# Patient Record
Sex: Female | Born: 1970 | Race: Black or African American | Hispanic: No | Marital: Single | Smoking: Never smoker
Health system: Southern US, Community
[De-identification: ages and names within clinical notes are randomized; demographics above are authoritative.]

## PROBLEM LIST (undated history)

## (undated) DIAGNOSIS — N83201 Unspecified ovarian cyst, right side: Secondary | ICD-10-CM

## (undated) DIAGNOSIS — R971 Elevated cancer antigen 125 [CA 125]: Secondary | ICD-10-CM

## (undated) DIAGNOSIS — K921 Melena: Secondary | ICD-10-CM

## (undated) DIAGNOSIS — N809 Endometriosis, unspecified: Secondary | ICD-10-CM

## (undated) DIAGNOSIS — N926 Irregular menstruation, unspecified: Secondary | ICD-10-CM

## (undated) DIAGNOSIS — B019 Varicella without complication: Secondary | ICD-10-CM

## (undated) DIAGNOSIS — D219 Benign neoplasm of connective and other soft tissue, unspecified: Secondary | ICD-10-CM

## (undated) DIAGNOSIS — R87619 Unspecified abnormal cytological findings in specimens from cervix uteri: Secondary | ICD-10-CM

## (undated) DIAGNOSIS — IMO0002 Reserved for concepts with insufficient information to code with codable children: Secondary | ICD-10-CM

## (undated) DIAGNOSIS — B009 Herpesviral infection, unspecified: Secondary | ICD-10-CM

## (undated) DIAGNOSIS — K635 Polyp of colon: Secondary | ICD-10-CM

## (undated) DIAGNOSIS — R109 Unspecified abdominal pain: Secondary | ICD-10-CM

## (undated) HISTORY — DX: Benign neoplasm of connective and other soft tissue, unspecified: D21.9

## (undated) HISTORY — DX: Elevated cancer antigen 125 (CA 125): R97.1

## (undated) HISTORY — DX: Melena: K92.1

## (undated) HISTORY — DX: Endometriosis, unspecified: N80.9

## (undated) HISTORY — DX: Irregular menstruation, unspecified: N92.6

## (undated) HISTORY — DX: Polyp of colon: K63.5

## (undated) HISTORY — DX: Unspecified abnormal cytological findings in specimens from cervix uteri: R87.619

## (undated) HISTORY — DX: Reserved for concepts with insufficient information to code with codable children: IMO0002

## (undated) HISTORY — DX: Varicella without complication: B01.9

## (undated) HISTORY — DX: Unspecified abdominal pain: R10.9

## (undated) HISTORY — DX: Unspecified ovarian cyst, right side: N83.201

## (undated) HISTORY — DX: Herpesviral infection, unspecified: B00.9

---

## 2005-05-11 HISTORY — PX: MYOMECTOMY: SHX85

## 2009-05-20 LAB — HM COLONOSCOPY

## 2011-02-09 LAB — HM MAMMOGRAPHY

## 2011-11-02 ENCOUNTER — Other Ambulatory Visit: Payer: Self-pay | Admitting: Family Medicine

## 2011-11-02 DIAGNOSIS — N6325 Unspecified lump in the left breast, overlapping quadrants: Secondary | ICD-10-CM

## 2011-11-05 ENCOUNTER — Other Ambulatory Visit: Payer: Self-pay

## 2011-11-10 ENCOUNTER — Ambulatory Visit
Admission: RE | Admit: 2011-11-10 | Discharge: 2011-11-10 | Disposition: A | Discharge status: Home / Self Care | Payer: BC Managed Care – PPO | Source: Ambulatory Visit | Attending: Family Medicine | Admitting: Family Medicine

## 2011-11-10 DIAGNOSIS — N6325 Unspecified lump in the left breast, overlapping quadrants: Secondary | ICD-10-CM

## 2012-02-19 ENCOUNTER — Encounter: Payer: Self-pay | Admitting: Gynecologic Oncology

## 2012-02-23 ENCOUNTER — Ambulatory Visit: Payer: BC Managed Care – PPO | Attending: Gynecologic Oncology | Admitting: Gynecologic Oncology

## 2012-02-23 ENCOUNTER — Encounter: Payer: Self-pay | Admitting: Gynecologic Oncology

## 2012-02-23 VITALS — BP 100/60 | HR 60 | Temp 98.0°F | Resp 14 | Ht 67.72 in | Wt 170.5 lb

## 2012-02-23 DIAGNOSIS — N7013 Chronic salpingitis and oophoritis: Secondary | ICD-10-CM | POA: Insufficient documentation

## 2012-02-23 DIAGNOSIS — R1900 Intra-abdominal and pelvic swelling, mass and lump, unspecified site: Secondary | ICD-10-CM | POA: Insufficient documentation

## 2012-02-23 DIAGNOSIS — N83209 Unspecified ovarian cyst, unspecified side: Secondary | ICD-10-CM | POA: Insufficient documentation

## 2012-02-23 DIAGNOSIS — N949 Unspecified condition associated with female genital organs and menstrual cycle: Secondary | ICD-10-CM

## 2012-02-23 DIAGNOSIS — N946 Dysmenorrhea, unspecified: Secondary | ICD-10-CM | POA: Insufficient documentation

## 2012-02-23 NOTE — Patient Instructions (Signed)
Options presented. 1.  Resumption of Lupron with estrogen add back. I would also recommend repeat ultrasound in 3 months with a repeat CA 125. If there is an increase in any of the masses or CA 125 at that time surgical evaluation would be recommended with a goal of bilateral salpingo-oophorectomy and hysterectomy. The 3 months of Lupron will decrease the morbidity of the surgical intervention.   2.  Exploratory laparotomy with bilateral salpingo-oophorectomy and possible hysterectomy now. There is the possibility of a rectosigmoid resection with a very small chance of requiring a colostomy. The procedure could be limited to bilateral salpingo-oophorectomy. 3.  Lupron with estrogen add back for 9 months with then a plan for definitive management of her endometriosis which would be with bilateral salpingo-oophorectomy and hysterectomy.  You have opted to to proceed with option 1.  Followup with Dr. Renaldo Fiddler for Lupron administration.

## 2012-02-23 NOTE — Progress Notes (Signed)
Consult Note: Gyn-Onc  Consult was requested by Dr. Freida Martinez for the evaluation of Eileen Martinez 41 y.o. female  CC:  Chief Complaint  Patient presents with  . adnexal cyst    HPI:  This is a 41 y/o Go with severe dysmenorrhea since 41 years old.  Initially managed unsuccessfuly with Tylenol and OCP.  Exp lap 2007 c/w severe endometriosis.  She underwent exploratory laparotomy,  apendectomy, colon resection and endometrial ablation. Residual endometriosis noted between colon and uterus.  Subsequently received lupron x 3 years with estrogen add back with immense relief.  Last dose of lupron 12/2008. She was then started on OCP x several months without subsequent pain.  Eileen Martinez states that she was doing well until approximately about 2 months ago when she was amenorrheic for 2 months.  OCP were resumed with the subsequent menses and severe dysmenorrhea.  An ultrasound collected on 01/18/2012 was notable for a 4.3 cm intramural leiomyoma. There is a 6.2 x 4.2 cm simple right ovarian cyst without blood flow. A 2.1 x 1.8 cm simple left ovarian cyst was appreciated. There was an 8.8 x 1.8 x 2.7 cm hydrosalpinx on the left. There was no blood flow seen. CA 125 is noticeably or 36.3 on September 20th 2013 and elevated to the 112.2 on 02/16/2012.  The day prior she presented to the emergency room with severe dysmenorrhea and associated nausea.    Eileen Martinez  Denies dyspareunia.     Current Meds:  Outpatient Encounter Prescriptions as of 02/23/2012  Medication Sig Dispense Refill  . levonorgestrel-ethinyl estradiol (ALTAVERA) 0.15-30 MG-MCG tablet Take 1 tablet by mouth daily.        Allergy: No Known Allergies  Social Hx:   History   Social History  . Marital Status: Single    Spouse Name: N/A    Number of Children: N/A  . Years of Education: N/A   Occupational History  . Not on file.   Social History Main Topics  . Smoking status: Never Smoker   . Smokeless tobacco: Not on file  .  Alcohol Use: No  . Drug Use: No  . Sexually Active: Yes   Other Topics Concern  . Not on file   Social History Narrative  . No narrative on file    Past Surgical Hx:  Past Surgical History  Procedure Date  . Myomectomy     Past Medical Hx:  Past Medical History  Diagnosis Date  . Abnormal Pap smear   . Herpes   . Fibroid   . Ovarian cyst, right     6cm  . Irregular menses   . Abdominal pain   . Endometriosis   . Elevated CA-125     Past Gynecological History:  G0  Patient's last menstrual period was 02/17/2012. severe dysmenorrhea since the age of 71 a history of endometriosis  Family Hx: No family history on file.  Review of Systems:  Constitutional  Feels well, Cardiovascular  No chest pain, shortness of breath, or edema  Pulmonary  No cough or wheeze.  Gastro Intestinal  No nausea, vomitting, or diarrhoea. No bright red blood per rectum, no abdominal pain, change in bowel movement, or constipation.  Genito Urinary  No frequency, urgency, dysuria,  currently on her menses, reports severe dysmenorrhea Musculo Skeletal  No myalgia, arthralgia, joint swelling or pain  Neurologic  No weakness, numbness, change in gait,  Psychology  No depression, anxiety, insomnia.   Vitals:  Blood pressure 100/60, pulse 60, temperature  98 F (36.7 C), temperature source Oral, resp. rate 14, height 5' 7.72" (1.72 m), weight 170 lb 8 oz (77.338 kg), last menstrual period 02/17/2012.  Physical Exam: WD in NAD Neck  Supple NROM, without any enlargements.  Lymph Node Survey No cervical supraclavicular or inguinal adenopathy Cardiovascular  Pulse normal rate, regularity and rhythm. S1 and S2 normal.  Lungs  Clear to auscultation bilateraly, without wheezes/crackles/rhonchi. Good air movement.  Psychiatry  Alert and oriented to person, place, and time  Abdomen  Normoactive bowel sounds, abdomen soft, non-tender and obese. Pfannenstiel incision intact without evidence of  hernia.  Back No CVA tenderness Genito Urinary  Vulva/vagina: Normal external female genitalia.  No lesions.   Bladder/urethra:  No lesions or masses  Vagina: Blood noted within the vaginal vault  Cervix: Normal appearing, no lesions.  Uterus: Lower uterine leiomyoma appreciated. No nodularity noted in the cul-de-sac .  Adnexa: No palpable masses. Rectal  Good tone, no masses no cul de sac nodularity.  Extremities  No bilateral cyanosis, clubbing or edema.   Assessment/Plan:  Ms. Eileen Martinez  is a 41 y.o.  year old with a history of severe dysmenorrhea and subsequent diagnosis of endometriosis that was successfully treated with appendectomy resection of her colon and endometrial ablation. This was followed by a 3 years of Lupron with estrogen add back and then oral contraceptive pills. The patient's symptomatology is consistent with a recurrence of her endometriosis.  Imaging is notable for a right simple cyst and a left hydrosalpinx.  Her CA 125 increased to 112 at the time of menses The following options were presented to the patient.  1.  Resumption of Lupron with estrogen add back. I would also recommend repeat ultrasound in 3 months with a repeat CA 125. If there is an increase in any of the masses or CA 125 at that time surgical evaluation would be recommended with a goal of bilateral salpingo-oophorectomy and hysterectomy. The 3 months of Lupron with decrease the morbidity of the surgical intervention.  2.  Exploratory laparotomy with bilateral salpingo-oophorectomy and possible hysterectomy now. The patient understands that there is the possibility of a rectosigmoid resection with a very small chance of requiring a colostomy. She also is aware that the procedure could be limited to bilateral salpingo-oophorectomy. 3.  Lupron with estrogen add back for 9 months with then a plan for definitive management of her endometriosis which would be with bilateral salpingo-oophorectomy and  hysterectomy.  The patient opts to proceed with option 1. She will contact Dr. Renaldo Fiddler to schedule Lupron treatment.   Laurette Schimke, MD, PhD 02/23/2012, 10:55 AM

## 2012-03-04 ENCOUNTER — Ambulatory Visit (INDEPENDENT_AMBULATORY_CARE_PROVIDER_SITE_OTHER): Payer: BC Managed Care – PPO | Admitting: Family Medicine

## 2012-03-04 ENCOUNTER — Encounter: Payer: Self-pay | Admitting: Family Medicine

## 2012-03-04 VITALS — BP 108/70 | HR 89 | Temp 98.2°F

## 2012-03-04 DIAGNOSIS — Z13220 Encounter for screening for lipoid disorders: Secondary | ICD-10-CM

## 2012-03-04 DIAGNOSIS — N809 Endometriosis, unspecified: Secondary | ICD-10-CM

## 2012-03-04 DIAGNOSIS — J309 Allergic rhinitis, unspecified: Secondary | ICD-10-CM | POA: Insufficient documentation

## 2012-03-04 DIAGNOSIS — Z23 Encounter for immunization: Secondary | ICD-10-CM

## 2012-03-04 DIAGNOSIS — Z131 Encounter for screening for diabetes mellitus: Secondary | ICD-10-CM

## 2012-03-04 DIAGNOSIS — L732 Hidradenitis suppurativa: Secondary | ICD-10-CM

## 2012-03-04 LAB — LIPID PANEL: Total CHOL/HDL Ratio: 4

## 2012-03-04 LAB — HEMOGLOBIN A1C: Hgb A1c MFr Bld: 4.7 % (ref 4.6–6.5)

## 2012-03-04 MED ORDER — CLINDAMYCIN PHOSPHATE 1 % EX GEL: Freq: Two times a day (BID) | CUTANEOUS | Status: DC

## 2012-03-04 NOTE — Addendum Note (Signed)
Addended by: Azucena Freed on: 03/04/2012 12:17 PM   Modules accepted: Orders

## 2012-03-04 NOTE — Progress Notes (Signed)
Chief Complaint  Patient presents with  . Establish Care    HPI: Eileen Martinez is here to establish care.  Has the following concerns today:  Hidradenitis Suppurativa: -dx one year ago after boil developed and pustules in axillary and groin area -being tx with clobetasol Propionate topical and initially was on Amoxicillin for 10 days -flared up now     Other Providers: -Sees Dr. Renaldo Fiddler with gyn for her yearly physicals and endometriosis, also being closely  followed by Dr. Nelly Rout - on Lupron therapy, hysterectomy being consider. -Sees Dr. [place] Callas - for AR -colonoscopy in 2011  Flu vaccine: refused Tdap: doesn't remember  ROS: See pertinent positives and negatives per HPI.  Past Medical History  Diagnosis Date  . Abnormal Pap smear   . Herpes   . Fibroid   . Ovarian cyst, right     6cm  . Irregular menses   . Abdominal pain   . Endometriosis   . Elevated CA-125   . Blood in stool   . Chicken pox   . Polyp of colon     Family History  Problem Relation Age of Onset  . Arthritis      parent  . Breast cancer Other   . Heart disease Other   . Stroke Other   . Hypertension      parent  . Hypertension Other   . Mental illness Other   . Diabetes Other     History   Social History  . Marital Status: Single    Spouse Name: N/A    Number of Children: N/A  . Years of Education: N/A   Social History Main Topics  . Smoking status: Never Smoker   . Smokeless tobacco: None  . Alcohol Use: No  . Drug Use: No  . Sexually Active: Yes   Other Topics Concern  . None   Social History Narrative  . None    Current outpatient prescriptions:clobetasol ointment (TEMOVATE) 0.05 %, Apply 1 application topically 2 (two) times daily., Disp: , Rfl: ;  EPIPEN 2-PAK 0.3 MG/0.3ML DEVI, , Disp: , Rfl: ;  HYDROcodone-acetaminophen (NORCO/VICODIN) 5-325 MG per tablet, Take 1 tablet by mouth every 6 (six) hours as needed. , Disp: , Rfl: ;  levonorgestrel-ethinyl estradiol  (ALTAVERA) 0.15-30 MG-MCG tablet, Take 1 tablet by mouth daily., Disp: , Rfl:  Mefenamic Acid 250 MG CAPS, , Disp: , Rfl: ;  montelukast (SINGULAIR) 10 MG tablet, , Disp: , Rfl: ;  ondansetron (ZOFRAN-ODT) 4 MG disintegrating tablet, , Disp: , Rfl: ;  PATADAY 0.2 % SOLN, , Disp: , Rfl: ;  PROAIR HFA 108 (90 BASE) MCG/ACT inhaler, , Disp: , Rfl: ;  QNASL 80 MCG/ACT AERS, , Disp: , Rfl: ;  Spacer/Aero-Holding Chambers (AEROCHAMBER MAX W/FLOW-VU) MISC, , Disp: , Rfl:  sulfamethoxazole-trimethoprim (BACTRIM DS) 800-160 MG per tablet, , Disp: , Rfl: ;  clindamycin (CLINDAGEL) 1 % gel, Apply topically 2 (two) times daily. To affected areas, Disp: 30 g, Rfl: 3  EXAM:  Filed Vitals:   03/04/12 0919  BP: 108/70  Pulse: 89  Temp: 98.2 F (36.8 C)    There is no height or weight on file to calculate BMI.  GENERAL: vitals reviewed and listed above, alert, oriented, appears well hydrated and in no acute distress  HEENT: atraumatic, conjunttiva clear, no obvious abnormalities on inspection of external nose and ears  NECK: no obvious masses on inspection  LUNGS: clear to auscultation bilaterally, no wheezes, rales or rhonchi, good air movement  CV: HRRR, no peripheral edema  SKIN: scattered small papules axillary and groin region  MS: moves all extremities without noticeable abnormality  PSYCH: pleasant and cooperative, no obvious depression or anxiety  ASSESSMENT AND PLAN:  Discussed the following assessment and plan:  1. Screening for diabetes mellitus  Hemoglobin A1c  2. Screening for hypercholesterolemia  Lipid Panel  3. Hidradenitis  clindamycin (CLINDAGEL) 1 % gel  4. Endometriosis, followed by Dr. Renaldo Fiddler and Dr. Nelly Rout    5. Allergic rhinitis, sees Dr. [place] Callas     -We reviewed the PMH, PSH, FH, SH, Meds and Allergies. -We addressed current concerns per orders and patient instructions. -We have asked for records for pertinent exams, studies, vaccines and notes from previous  providers. -We have advised patient to follow up per instructions below. -Influenza vaccine refused, tdap given  -Patient advised to return as scheduled or notify us if new concerns arise.  Patient Instructions  -We have ordered labs or studies at this visit. It can take up to 1-2 weeks for results and processing. We will contact you with instructions IF your results are abnormal. Normal results will be released to your [facility]. If you have not heard from [place] or can not find your results in [facility] in 2 weeks please contact our office.  -PLEASE SIGN UP FOR MYCHART TODAY   We recommend the following healthy lifestyle measures: - eat a healthy diet consisting of lots of vegetables, fruits, beans, nuts, seeds, healthy meats such as white chicken and fish and whole grains.  - avoid fried foods, fast food, processed foods, sodas, red meet and other fattening foods.  - get a least 150 minutes of aerobic exercise per week.   FOR THE HIDRADENITIS: -loose cool cotton clothing -hypoallergenic soaps and detergents -to not touch or manipulate lesions -use antibiotic gel as instructed -can try decreasing dairy and or carbs in diet -dove clear tone Deoderant  Follow up in: 2 months      Junelle Hashemi R.

## 2012-03-04 NOTE — Patient Instructions (Addendum)
-  We have ordered labs or studies at this visit. It can take up to 1-2 weeks for results and processing. We will contact you with instructions IF your results are abnormal. Normal results will be released to your [facility]. If you have not heard from [place] or can not find your results in [facility] in 2 weeks please contact our office.  -PLEASE SIGN UP FOR MYCHART TODAY   We recommend the following healthy lifestyle measures: - eat a healthy diet consisting of lots of vegetables, fruits, beans, nuts, seeds, healthy meats such as white chicken and fish and whole grains.  - avoid fried foods, fast food, processed foods, sodas, red meet and other fattening foods.  - get a least 150 minutes of aerobic exercise per week.   FOR THE HIDRADENITIS: -loose cool cotton clothing -hypoallergenic soaps and detergents -to not touch or manipulate lesions -use antibiotic gel as instructed -can try decreasing dairy and or carbs in diet -dove clear tone Deoderant  Follow up in: 2 months

## 2012-04-28 ENCOUNTER — Ambulatory Visit (INDEPENDENT_AMBULATORY_CARE_PROVIDER_SITE_OTHER): Payer: BC Managed Care – PPO | Admitting: Family Medicine

## 2012-04-28 ENCOUNTER — Encounter: Payer: Self-pay | Admitting: Family Medicine

## 2012-04-28 VITALS — BP 102/70 | HR 61 | Temp 98.6°F | Wt 166.0 lb

## 2012-04-28 DIAGNOSIS — L732 Hidradenitis suppurativa: Secondary | ICD-10-CM

## 2012-04-28 NOTE — Progress Notes (Signed)
Chief Complaint  Patient presents with  . 2 month follow up    HPI:  Follow up:  Hidradenitis: -tx with supportive care and clinda gel last visit -she has followed all of the recommendations and has been great - no bumps at all -hasn't had to use gel for about a month and all has been great -she has made big changes in diet - increased fiber, healthier diet - she is doing great -had follow up with her gynecologist yesterday and fibroids shrunk and cysts disappeared - she is going to follow up in 3 months with them  ROS: See pertinent positives and negatives per HPI.  Past Medical History  Diagnosis Date  . Abnormal Pap smear   . Herpes   . Fibroid   . Ovarian cyst, right     6cm  . Irregular menses   . Abdominal pain   . Endometriosis   . Elevated CA-125   . Blood in stool   . Chicken pox   . Polyp of colon     Family History  Problem Relation Age of Onset  . Arthritis      parent  . Breast cancer Other   . Heart disease Other   . Stroke Other   . Hypertension      parent  . Hypertension Other   . Mental illness Other   . Diabetes Other     History   Social History  . Marital Status: Single    Spouse Name: N/A    Number of Children: N/A  . Years of Education: N/A   Social History Main Topics  . Smoking status: Never Smoker   . Smokeless tobacco: None  . Alcohol Use: No  . Drug Use: No  . Sexually Active: Yes   Other Topics Concern  . None   Social History Narrative  . None    Current outpatient prescriptions:clindamycin (CLINDAGEL) 1 % gel, Apply topically 2 (two) times daily. To affected areas, Disp: 30 g, Rfl: 3;  clobetasol ointment (TEMOVATE) 0.05 %, Apply 1 application topically 2 (two) times daily., Disp: , Rfl: ;  EPIPEN 2-PAK 0.3 MG/0.3ML DEVI, , Disp: , Rfl: ;  HYDROcodone-acetaminophen (NORCO/VICODIN) 5-325 MG per tablet, Take 1 tablet by mouth every 6 (six) hours as needed. , Disp: , Rfl:  levonorgestrel-ethinyl estradiol (ALTAVERA)  0.15-30 MG-MCG tablet, Take 1 tablet by mouth daily., Disp: , Rfl: ;  Mefenamic Acid 250 MG CAPS, , Disp: , Rfl: ;  montelukast (SINGULAIR) 10 MG tablet, , Disp: , Rfl: ;  ondansetron (ZOFRAN-ODT) 4 MG disintegrating tablet, , Disp: , Rfl: ;  PATADAY 0.2 % SOLN, , Disp: , Rfl: ;  PROAIR HFA 108 (90 BASE) MCG/ACT inhaler, , Disp: , Rfl: ;  QNASL 80 MCG/ACT AERS, , Disp: , Rfl:  Spacer/Aero-Holding Chambers (AEROCHAMBER MAX W/FLOW-VU) MISC, , Disp: , Rfl: ;  sulfamethoxazole-trimethoprim (BACTRIM DS) 800-160 MG per tablet, , Disp: , Rfl:   EXAM:  Filed Vitals:   04/28/12 1004  BP: 102/70  Pulse: 61  Temp: 98.6 F (37 C)    There is no height on file to calculate BMI.  GENERAL: vitals reviewed and listed above, alert, oriented, appears well hydrated and in no acute distress  MS: moves all extremities without noticeable abnormality  PSYCH: pleasant and cooperative, no obvious depression or anxiety  ASSESSMENT AND PLAN:  Discussed the following assessment and plan:  1. Hidradenitis   -resolved, patient is very happy about this -Patient advised to return  or notify a doctor immediately if symptoms worsen or persist or new concerns arise.  Patient Instructions  -follow up yearly and as needed     Siomara Burkel, Dahlia Client R.

## 2012-04-28 NOTE — Patient Instructions (Signed)
-  follow up yearly and as needed

## 2012-06-25 ENCOUNTER — Other Ambulatory Visit: Payer: Self-pay

## 2012-09-30 ENCOUNTER — Encounter: Payer: Self-pay | Admitting: Family Medicine

## 2012-09-30 NOTE — Progress Notes (Signed)
OV notes from asthma and allergy visit from 09/23/12 scanned in.

## 2013-03-16 ENCOUNTER — Other Ambulatory Visit: Payer: Self-pay

## 2013-04-28 ENCOUNTER — Ambulatory Visit (INDEPENDENT_AMBULATORY_CARE_PROVIDER_SITE_OTHER): Payer: BC Managed Care – PPO | Admitting: Family Medicine

## 2013-04-28 VITALS — BP 98/68 | Temp 99.2°F | Wt 171.0 lb

## 2013-04-28 DIAGNOSIS — J309 Allergic rhinitis, unspecified: Secondary | ICD-10-CM

## 2013-04-28 DIAGNOSIS — H10029 Other mucopurulent conjunctivitis, unspecified eye: Secondary | ICD-10-CM

## 2013-04-28 MED ORDER — FLUTICASONE PROPIONATE 50 MCG/ACT NA SUSP: 2.0000 | Freq: Every day | NASAL | Status: DC

## 2013-04-28 MED ORDER — SULFACETAMIDE SODIUM 10 % OP SOLN: 1.0000 [drp] | OPHTHALMIC | Status: DC

## 2013-04-28 NOTE — Progress Notes (Signed)
Chief Complaint  Patient presents with  . right eye pain    HPI:  Acute visit for eye pain: -started a few days ago -symptoms: laryngitis, nasal congestion, R eye irritated and red, clear drainage -denies: fevers, vision changes, SOB, NVD -works in school  Allergies: -constant nasal symptoms and PND -has not used INS -sees allergist  ROS: See pertinent positives and negatives per HPI.  Past Medical History  Diagnosis Date  . Abnormal Pap smear   . Herpes   . Fibroid   . Ovarian cyst, right     6cm  . Irregular menses   . Abdominal pain   . Endometriosis   . Elevated CA-125   . Blood in stool   . Chicken pox   . Polyp of colon     Past Surgical History  Procedure Laterality Date  . Myomectomy  2007    Family History  Problem Relation Age of Onset  . Arthritis      parent  . Breast cancer Other   . Heart disease Other   . Stroke Other   . Hypertension      parent  . Hypertension Other   . Mental illness Other   . Diabetes Other     History   Social History  . Marital Status: Single    Spouse Name: N/A    Number of Children: N/A  . Years of Education: N/A   Social History Main Topics  . Smoking status: Never Smoker   . Smokeless tobacco: Not on file  . Alcohol Use: No  . Drug Use: No  . Sexual Activity: Yes   Other Topics Concern  . Not on file   Social History Narrative  . No narrative on file    Current outpatient prescriptions:clobetasol ointment (TEMOVATE) 0.05 %, Apply 1 application topically 2 (two) times daily., Disp: , Rfl: ;  EPIPEN 2-PAK 0.3 MG/0.3ML DEVI, , Disp: , Rfl: ;  fluticasone (FLONASE) 50 MCG/ACT nasal spray, Place 2 sprays into both nostrils daily., Disp: 16 g, Rfl: 1;  levonorgestrel-ethinyl estradiol (ALTAVERA) 0.15-30 MG-MCG tablet, Take 1 tablet by mouth daily., Disp: , Rfl:  Mefenamic Acid 250 MG CAPS, , Disp: , Rfl: ;  montelukast (SINGULAIR) 10 MG tablet, , Disp: , Rfl: ;  ondansetron (ZOFRAN-ODT) 4 MG  disintegrating tablet, , Disp: , Rfl: ;  PATADAY 0.2 % SOLN, , Disp: , Rfl: ;  PROAIR HFA 108 (90 BASE) MCG/ACT inhaler, , Disp: , Rfl: ;  QNASL 80 MCG/ACT AERS, , Disp: , Rfl: ;  Spacer/Aero-Holding Chambers (AEROCHAMBER MAX W/FLOW-VU) MISC, , Disp: , Rfl:  sulfacetamide (BLEPH-10) 10 % ophthalmic solution, Place 1 drop into both eyes every 3 (three) hours., Disp: 15 mL, Rfl: 0  EXAM:  Filed Vitals:   04/28/13 1042  BP: 98/68  Temp: 99.2 F (37.3 C)    Body mass index is 26.22 kg/(m^2).  GENERAL: vitals reviewed and listed above, alert, oriented, appears well hydrated and in no acute distress  HEENT: atraumatic, conjunctival erythema bilat, visual acuity grossly intact, PERRLA, clear drainage from eye, no obvious abnormalities on inspection of external nose and ears,  clear nasal congestion, mild post oropharyngeal erythema with PND, no tonsillar edema or exudate, no sinus TTP  NECK: no obvious masses on inspection  MS: moves all extremities without noticeable abnormality  PSYCH: pleasant and cooperative, no obvious depression or anxiety  ASSESSMENT AND PLAN:  Discussed the following assessment and plan:  Pink eye, unspecified laterality - Plan: sulfacetamide (  BLEPH-10) 10 % ophthalmic solution  Allergic rhinitis, sees Dr. 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  There are no Patient Instructions on file for this visit.   Kriste Basque R.

## 2013-04-28 NOTE — Progress Notes (Signed)
Pre visit review using our clinic review tool, if applicable. No additional management support is needed unless otherwise documented below in the visit note. 

## 2013-06-13 ENCOUNTER — Ambulatory Visit: Payer: Self-pay | Admitting: Internal Medicine

## 2013-06-13 ENCOUNTER — Ambulatory Visit (INDEPENDENT_AMBULATORY_CARE_PROVIDER_SITE_OTHER): Payer: BC Managed Care – PPO | Admitting: Family Medicine

## 2013-06-13 ENCOUNTER — Telehealth: Payer: Self-pay | Admitting: Family Medicine

## 2013-06-13 ENCOUNTER — Encounter: Payer: Self-pay | Admitting: Family Medicine

## 2013-06-13 VITALS — BP 122/80 | Temp 98.4°F | Wt 175.0 lb

## 2013-06-13 DIAGNOSIS — R350 Frequency of micturition: Secondary | ICD-10-CM

## 2013-06-13 LAB — POCT URINALYSIS DIPSTICK
GLUCOSE UA: NEGATIVE
Nitrite, UA: POSITIVE
SPEC GRAV UA: 1.02
UROBILINOGEN UA: 1
pH, UA: 7

## 2013-06-13 MED ORDER — CIPROFLOXACIN HCL 250 MG PO TABS: 250.0000 mg | ORAL_TABLET | Freq: Two times a day (BID) | ORAL | Status: DC

## 2013-06-13 NOTE — Telephone Encounter (Signed)
Done. Pt to arrive at 3pm

## 2013-06-13 NOTE — Telephone Encounter (Signed)
Patient Information:  Caller Name: Etienne  Phone: [phone]  Patient: Eileen Martinez, Eileen Martinez  Gender: Female  DOB: Sep 16, 1970  Age: 43 Years  PCP: Maudie Mercury (TEXT 1st, after 20 mins can call), [facility])  Pregnant: No  Office Follow Up:  Does the office need to follow up with this patient?: No  Instructions For The Office: N/A  RN Note:  appt scheduled for today at 1715 with Dr.Johns at [facility]  Symptoms  Reason For Call & Symptoms: c/o frequency and urgency since 1200 today; says it has been constant; denies pain or burning; feels like a tickling; currently on menstrual period; denies back/flank pain  Reviewed Health History In EMR: Yes  Reviewed Medications In EMR: Yes  Reviewed Allergies In EMR: Yes  Reviewed Surgeries / Procedures: Yes  Date of Onset of Symptoms: 06/13/2013 OB / GYN:  LMP: 06/11/2013  Guideline(s) Used:  Urination Pain - Female  Disposition Per Guideline:   See Today in Office  Reason For Disposition Reached:   All other females with painful urination, or patient wants to be seen  Advice Given:  N/A  Patient Will Follow Care Advice:  YES  Appointment Scheduled:  06/13/2013 17:15:00 Appointment Scheduled Provider:  Other

## 2013-06-13 NOTE — Telephone Encounter (Signed)
Work in

## 2013-06-13 NOTE — Progress Notes (Signed)
Pre visit review using our clinic review tool, if applicable. No additional management support is needed unless otherwise documented below in the visit note. 

## 2013-06-13 NOTE — Progress Notes (Signed)
Chief Complaint  Patient presents with  . Urinary Frequency    HPI:  Work in for dysuria: -started today -symptoms: urinary frequency and urgency -denies: fever,chills, nvd, flank pain, gross hematuria  ROS: See pertinent positives and negatives per HPI.  Past Medical History  Diagnosis Date  . Abnormal Pap smear   . Herpes   . Fibroid   . Ovarian cyst, right     6cm  . Irregular menses   . Abdominal pain   . Endometriosis   . Elevated CA-125   . Blood in stool   . Chicken pox   . Polyp of colon     Past Surgical History  Procedure Laterality Date  . Myomectomy  2007    Family History  Problem Relation Age of Onset  . Arthritis      parent  . Breast cancer Other   . Heart disease Other   . Stroke Other   . Hypertension      parent  . Hypertension Other   . Mental illness Other   . Diabetes Other     History   Social History  . Marital Status: Single    Spouse Name: N/A    Number of Children: N/A  . Years of Education: N/A   Social History Main Topics  . Smoking status: Never Smoker   . Smokeless tobacco: None  . Alcohol Use: No  . Drug Use: No  . Sexual Activity: Yes   Other Topics Concern  . None   Social History Narrative  . None    Current outpatient prescriptions:clobetasol ointment (TEMOVATE) 4.09 %, Apply 1 application topically 2 (two) times daily., Disp: , Rfl: ;  EPIPEN 2-PAK 0.3 MG/0.3ML DEVI, , Disp: , Rfl: ;  fluticasone (FLONASE) 50 MCG/ACT nasal spray, Place 2 sprays into both nostrils daily., Disp: 16 g, Rfl: 1;  levonorgestrel-ethinyl estradiol (ALTAVERA) 0.15-30 MG-MCG tablet, Take 1 tablet by mouth daily., Disp: , Rfl:  Mefenamic Acid 250 MG CAPS, , Disp: , Rfl: ;  montelukast (SINGULAIR) 10 MG tablet, , Disp: , Rfl: ;  ondansetron (ZOFRAN-ODT) 4 MG disintegrating tablet, , Disp: , Rfl: ;  PATADAY 0.2 % SOLN, , Disp: , Rfl: ;  PROAIR HFA 108 (90 BASE) MCG/ACT inhaler, , Disp: , Rfl: ;  QNASL 80 MCG/ACT AERS, , Disp: , Rfl: ;   Spacer/Aero-Holding Chambers (AEROCHAMBER MAX W/FLOW-VU) MISC, , Disp: , Rfl:  sulfacetamide (BLEPH-10) 10 % ophthalmic solution, Place 1 drop into both eyes every 3 (three) hours., Disp: 15 mL, Rfl: 0;  ciprofloxacin (CIPRO) 250 MG tablet, Take 1 tablet (250 mg total) by mouth 2 (two) times daily., Disp: 6 tablet, Rfl: 0  EXAM:  Filed Vitals:   06/13/13 1453  BP: 122/80  Temp: 98.4 F (36.9 C)    Body mass index is 26.83 kg/(m^2).  GENERAL: vitals reviewed and listed above, alert, oriented, appears well hydrated and in no acute distress  HEENT: atraumatic, conjunttiva clear, no obvious abnormalities on inspection of external nose and ears  ABD: no CVA TTP  CV: HRRR, no peripheral edema  MS: moves all extremities without noticeable abnormality  PSYCH: pleasant and cooperative, no obvious depression or anxiety  ASSESSMENT AND PLAN:  Discussed the following assessment and plan:  Urinary frequency - Plan: POCT urinalysis dipstick, Culture, Urine, ciprofloxacin (CIPRO) 250 MG tablet  -udip c/w likely uti -empiric txa nd culture pending -Patient advised to return or notify a doctor immediately if symptoms worsen or persist or new concerns  arise.  Patient Instructions  -lots of fluids - cranberry juice  -azo per instructions as needed  -As we discussed, we have prescribed a new medication (ciprofloxacin) for you at this appointment. We discussed the common and serious potential adverse effects of this medication and you can review these and more with the pharmacist when you pick up your medication.  Please follow the instructions for use carefully and notify us immediately if you have any problems taking this medication.  -follow up as needed      Donnika Kucher, Jarrett Soho R.

## 2013-06-13 NOTE — Patient Instructions (Signed)
-  lots of fluids - cranberry juice  -azo per instructions as needed  -As we discussed, we have prescribed a new medication (ciprofloxacin) for you at this appointment. We discussed the common and serious potential adverse effects of this medication and you can review these and more with the pharmacist when you pick up your medication.  Please follow the instructions for use carefully and notify us immediately if you have any problems taking this medication.  -follow up as needed

## 2013-06-13 NOTE — Telephone Encounter (Signed)
Pt would like appt today for poss uti. Pt is uncomfortable and would like to see dr kim today. Could not find any other appts. pls advise

## 2013-06-15 LAB — URINE CULTURE

## 2013-06-16 MED ORDER — CIPROFLOXACIN HCL 250 MG PO TABS: 250.0000 mg | ORAL_TABLET | Freq: Two times a day (BID) | ORAL | Status: DC

## 2013-06-16 NOTE — Addendum Note (Signed)
Addended by: Colleen Can on: 06/16/2013 10:23 AM   Modules accepted: Orders

## 2013-06-23 NOTE — Progress Notes (Signed)
Received 6 month follow up office notes from [facility] from 06/19/13.  Pataday is not controlling allergic conjunctivitis according to patient.  Current medications:  Zyrtec allergy, Qnasal aerosol 80 mcg, nasal saline irrigations,montelukast, proari hfa epi pen, continue clobetasol and stop Pataday and start azelastine 0.05.  Follow up in 6 months.  Sent to scan.

## 2013-10-24 ENCOUNTER — Telehealth: Payer: Self-pay | Admitting: Family Medicine

## 2013-10-24 DIAGNOSIS — L732 Hidradenitis suppurativa: Secondary | ICD-10-CM

## 2013-10-24 MED ORDER — CLINDAMYCIN PHOSPHATE 1 % EX GEL: Freq: Two times a day (BID) | CUTANEOUS | Status: DC

## 2013-10-24 NOTE — Telephone Encounter (Signed)
Ok. Rx sent.  FOR THE HIDRADENITIS: -loose cool cotton clothing  -hypoallergenic soaps and detergents  -to not touch or manipulate lesions  -use antibiotic gel as instructed  -can try decreasing dairy and or carbs in diet  -dove clear tone Deoderant  Follow up in: 2 months

## 2013-10-24 NOTE — Telephone Encounter (Signed)
Patient informed of the below information and I called the Rx in to [facility] at [phone].

## 2013-10-24 NOTE — Telephone Encounter (Signed)
Pt has a flair up of hydro titus (skin rash) and would like a new rx refill of  clindamycin (CLINDAGEL) 1 % gel  [facility] [address]

## 2013-11-17 ENCOUNTER — Telehealth: Payer: Self-pay | Admitting: Family Medicine

## 2013-11-17 NOTE — Telephone Encounter (Signed)
She uses this for hydradenitis. Does she need a prescription with this diagnosis?

## 2013-11-17 NOTE — Telephone Encounter (Signed)
Pt had requested an rx on clindamycin (CLINDAGEL) 1 % gel she was informed by [facility] that her insurance company will no longer pay for this rx. In order for them to pay they need something in writing to justify why pt need this medication .

## 2013-11-17 NOTE — Telephone Encounter (Signed)
Pt requested Dr Maudie Mercury send in a cheaper medication as the Clindamycin gel is $64 vs $12 which it was years ago.  I called the pt and advised her per Dr Maudie Mercury this is the cheapest topical antibiotic and it costs $54 at [facility] and we could send this in for her there or she could call around to different pharmacies to see if she can find this cheaper.  She stated she will check for a cheaper price and call back.

## 2014-06-22 LAB — HM COLONOSCOPY

## 2014-07-02 ENCOUNTER — Encounter: Payer: Self-pay | Admitting: Family Medicine

## 2014-10-12 ENCOUNTER — Ambulatory Visit (INDEPENDENT_AMBULATORY_CARE_PROVIDER_SITE_OTHER): Payer: BC Managed Care – PPO | Admitting: Family Medicine

## 2014-10-12 ENCOUNTER — Encounter: Payer: Self-pay | Admitting: Family Medicine

## 2014-10-12 VITALS — BP 100/64 | HR 54 | Temp 98.0°F | Ht 67.72 in | Wt 154.4 lb

## 2014-10-12 DIAGNOSIS — R300 Dysuria: Secondary | ICD-10-CM

## 2014-10-12 LAB — POCT URINALYSIS DIPSTICK
Bilirubin, UA: NEGATIVE
Blood, UA: NEGATIVE
GLUCOSE UA: NEGATIVE
Ketones, UA: NEGATIVE
Leukocytes, UA: NEGATIVE
NITRITE UA: NEGATIVE
Protein, UA: NEGATIVE
SPEC GRAV UA: 1.02
Urobilinogen, UA: 0.2
pH, UA: 6

## 2014-10-12 NOTE — Progress Notes (Signed)
Pre visit review using our clinic review tool, if applicable. No additional management support is needed unless otherwise documented below in the visit note. 

## 2014-10-12 NOTE — Progress Notes (Signed)
HPI:  Dysuria -hx endometriosis, very irregular periods - sees gyn for this -had some bladder pain/pressure this morning and mild vaginal bleeding -has had mild dysuria today, urgency and frequency -denies: fevers, chills, nausea, vomiting, vaginal discharge, hematuria, flank pain, concern for STI -worried about UTI  ROS: See pertinent positives and negatives per HPI.  Past Medical History  Diagnosis Date  . Abnormal Pap smear   . Herpes   . Fibroid   . Ovarian cyst, right     6cm  . Irregular menses   . Abdominal pain   . Endometriosis   . Elevated CA-125   . Blood in stool   . Chicken pox   . Polyp of colon     Past Surgical History  Procedure Laterality Date  . Myomectomy  2007    Family History  Problem Relation Age of Onset  . Arthritis      parent  . Breast cancer Other   . Heart disease Other   . Stroke Other   . Hypertension      parent  . Hypertension Other   . Mental illness Other   . Diabetes Other     History   Social History  . Marital Status: Single    Spouse Name: N/A  . Number of Children: N/A  . Years of Education: N/A   Social History Main Topics  . Smoking status: Never Smoker   . Smokeless tobacco: Not on file  . Alcohol Use: No  . Drug Use: No  . Sexual Activity: Yes   Other Topics Concern  . None   Social History Narrative     Current outpatient prescriptions:  .  EPIPEN 2-PAK 0.3 MG/0.3ML DEVI, , Disp: , Rfl:  .  fluticasone (FLONASE) 50 MCG/ACT nasal spray, Place 2 sprays into both nostrils daily., Disp: 16 g, Rfl: 1 .  montelukast (SINGULAIR) 10 MG tablet, , Disp: , Rfl:  .  ondansetron (ZOFRAN-ODT) 4 MG disintegrating tablet, , Disp: , Rfl:  .  PROAIR HFA 108 (90 BASE) MCG/ACT inhaler, , Disp: , Rfl:  .  QNASL 80 MCG/ACT AERS, , Disp: , Rfl:  .  Spacer/Aero-Holding Chambers (AEROCHAMBER MAX W/FLOW-VU) MISC, , Disp: , Rfl:  .  sulfacetamide (BLEPH-10) 10 % ophthalmic solution, Place 1 drop into both eyes every 3  (three) hours., Disp: 15 mL, Rfl: 0  EXAM:  Filed Vitals:   10/12/14 0928  BP: 100/64  Pulse: 54  Temp: 98 F (36.7 C)    Body mass index is 23.67 kg/(m^2).  GENERAL: vitals reviewed and listed above, alert, oriented, appears well hydrated and in no acute distress  HEENT: atraumatic, conjunttiva clear, no obvious abnormalities on inspection of external nose and ears  NECK: no obvious masses on inspection  LUNGS: clear to auscultation bilaterally, no wheezes, rales or rhonchi, good air movement  CV: HRRR, no peripheral edema  ABD: soft, NTTP, no CVA TTP  MS: moves all extremities without noticeable abnormality  PSYCH: pleasant and cooperative, no obvious depression or anxiety  ASSESSMENT AND PLAN:  Discussed the following assessment and plan:  Dysuria - Plan: POC Urinalysis Dipstick, Culture, Urine  -udip and culture, likely will have blood due to vag bleeding -advised to follow up with gyn about her endometriosis and irr bleeding and if symptoms persist or if no UTI -Patient advised to return or notify a doctor immediately if symptoms worsen or persist or new concerns arise.  There are no Patient Instructions on file for this  visit.   Colin Benton R.

## 2014-10-13 LAB — URINE CULTURE
COLONY COUNT: NO GROWTH
Organism ID, Bacteria: NO GROWTH

## 2015-11-25 ENCOUNTER — Encounter: Payer: Self-pay | Admitting: Family Medicine

## 2015-11-25 ENCOUNTER — Ambulatory Visit (INDEPENDENT_AMBULATORY_CARE_PROVIDER_SITE_OTHER): Payer: BC Managed Care – PPO | Admitting: Family Medicine

## 2015-11-25 ENCOUNTER — Encounter: Payer: BC Managed Care – PPO | Admitting: Family Medicine

## 2015-11-25 VITALS — BP 100/60 | HR 73 | Temp 98.0°F | Ht 68.0 in | Wt 172.7 lb

## 2015-11-25 DIAGNOSIS — M7120 Synovial cyst of popliteal space [Baker], unspecified knee: Secondary | ICD-10-CM | POA: Insufficient documentation

## 2015-11-25 DIAGNOSIS — K649 Unspecified hemorrhoids: Secondary | ICD-10-CM | POA: Insufficient documentation

## 2015-11-25 DIAGNOSIS — Z0000 Encounter for general adult medical examination without abnormal findings: Secondary | ICD-10-CM | POA: Diagnosis not present

## 2015-11-25 DIAGNOSIS — R232 Flushing: Secondary | ICD-10-CM

## 2015-11-25 DIAGNOSIS — N912 Amenorrhea, unspecified: Secondary | ICD-10-CM

## 2015-11-25 DIAGNOSIS — Z0001 Encounter for general adult medical examination with abnormal findings: Secondary | ICD-10-CM

## 2015-11-25 DIAGNOSIS — M7121 Synovial cyst of popliteal space [Baker], right knee: Secondary | ICD-10-CM

## 2015-11-25 DIAGNOSIS — E079 Disorder of thyroid, unspecified: Secondary | ICD-10-CM | POA: Diagnosis not present

## 2015-11-25 DIAGNOSIS — N951 Menopausal and female climacteric states: Secondary | ICD-10-CM

## 2015-11-25 LAB — POCT URINE PREGNANCY: PREG TEST UR: NEGATIVE

## 2015-11-25 LAB — HEMOGLOBIN A1C: Hgb A1c MFr Bld: 4.9 % (ref 4.6–6.5)

## 2015-11-25 LAB — TSH: TSH: 1.68 u[IU]/mL (ref 0.35–4.50)

## 2015-11-25 LAB — LIPID PANEL
CHOL/HDL RATIO: 3
Cholesterol: 212 mg/dL — ABNORMAL HIGH (ref 0–200)
HDL: 65.6 mg/dL (ref >39.00)
LDL Cholesterol: 137 mg/dL — ABNORMAL HIGH (ref 0–99)
NONHDL: 146.55
Triglycerides: 50 mg/dL (ref 0.0–149.0)
VLDL: 10 mg/dL (ref 0.0–40.0)

## 2015-11-25 NOTE — Addendum Note (Signed)
Addended by: Agnes Lawrence on: 11/25/2015 08:59 AM   Modules accepted: Orders

## 2015-11-25 NOTE — Progress Notes (Signed)
HPI:  Here for CPE:  -Concerns and/or follow up today:   Several new concerns: Hot flashes for several months FDLMP may 2017, usually regular Off endometriosis treatments and was doing well No pelvic pain, cramps, fevers, malaise Has gyn physical set up No concerned for STI or pregnancy Sees Eileen [facility] for hemorrhoids Sees Eileen otho for bakers cyst R knee - improved Currently taking pyridium and keflex for UTI  -Diet: variety of foods, balance and well rounded - just eliminated read meat, eating more veggies, fish  -Exercise: no regular exercise - but planning to start  -Taking folic acid, vitamin D or calcium: no  -Diabetes and Dyslipidemia Screening: FASTING for labs  -Hx of HTN: no  -Vaccines: UTD  -pap history: Sees Eileen [facility], on lupron in the past for endometriosis  -FDLMP: may 2017  -sexual activity: yes, female partner, no new partners  -wants STI testing (Hep C if born [birth year]): no  -FH breast, colon or ovarian ca: see FH Last mammogram: does with dr Julien [facility] Last colon cancer screening: n/a, sees [facility]  Breast Ca Risk Assessment: -does women's health with gyn  -Alcohol, Tobacco, drug use: see social history  Review of Systems - no fevers, unintentional weight loss, vision loss, hearing loss, chest pain, sob, hemoptysis, melena, hematochezia, hematuria, genital discharge, changing or concerning skin lesions, bleeding, bruising, loc, thoughts of self harm or SI  Past Medical History  Diagnosis Date  . Abnormal Pap smear   . Herpes   . Fibroid   . Ovarian cyst, right     6cm  . Irregular menses   . Abdominal pain   . Endometriosis   . Elevated CA-125   . Blood in stool   . Chicken pox   . Polyp of colon     Past Surgical History  Procedure Laterality Date  . Myomectomy  2007    Family History  Problem Relation Age of Onset  . Arthritis      parent  . Breast cancer Other   . Heart disease Other   . Stroke Other   . Hypertension     parent  . Hypertension Other   . Mental illness Other   . Diabetes Other     Social History   Social History  . Marital Status: Single    Spouse Name: N/A  . Number of Children: N/A  . Years of Education: N/A   Social History Main Topics  . Smoking status: Never Smoker   . Smokeless tobacco: None  . Alcohol Use: No  . Drug Use: No  . Sexual Activity: Yes   Other Topics Concern  . None   Social History Narrative     Current outpatient prescriptions:  .  cephALEXin (KEFLEX) 500 MG capsule, , Disp: , Rfl:  .  EPIPEN 2-PAK 0.3 MG/0.3ML DEVI, , Disp: , Rfl:  .  fluticasone (FLONASE) 50 MCG/ACT nasal spray, Place 2 sprays into both nostrils daily., Disp: 16 g, Rfl: 1 .  montelukast (SINGULAIR) 10 MG tablet, , Disp: , Rfl:  .  ondansetron (ZOFRAN-ODT) 4 MG disintegrating tablet, , Disp: , Rfl:  .  phenazopyridine (PYRIDIUM) 200 MG tablet, Take 200 mg by mouth 3 (three) times daily as needed for pain., Disp: , Rfl:  .  PROAIR HFA 108 (90 BASE) MCG/ACT inhaler, , Disp: , Rfl:  .  QNASL 80 MCG/ACT AERS, , Disp: , Rfl:  .  Spacer/Aero-Holding Chambers (AEROCHAMBER MAX W/FLOW-VU) MISC, , Disp: , Rfl:  .  sulfacetamide (BLEPH-10) 10 % ophthalmic solution, Place 1 drop into both eyes every 3 (three) hours., Disp: 15 mL, Rfl: 0  EXAM:  Filed Vitals:   11/25/15 0817  BP: 100/60  Pulse: 73  Temp: 98 F (36.7 C)    GENERAL: vitals reviewed and listed below, alert, oriented, appears well hydrated and in no acute distress  HEENT: head atraumatic, PERRLA, normal appearance of eyes, ears, nose and mouth. moist mucus membranes.  NECK: supple, thyroid appears and feels full R>L without any appreciable discreet masses on my exam - she thinks fuller then usually for awhile  LUNGS: clear to auscultation bilaterally, no rales, rhonchi or wheeze  CV: HRRR, no peripheral edema or cyanosis, normal pedal pulses  BREAST: declined, does gyn exam with gynecologist  ABDOMEN: bowel sounds  normal, soft, non tender to palpation, no masses, no rebound or guarding  [id number] does with gyn  SKIN: no rash or abnormal lesions  MS: normal gait, moves all extremities normally  NEURO: normal gait, speech and thought processing grossly intact, muscle tone grossly intact throughout  PSYCH: normal affect, pleasant and cooperative  ASSESSMENT AND PLAN:  Discussed the following assessment and plan:  Encounter for preventive health examination - Plan: Lipid Panel, Hemoglobin A1c -Discussed and advised all [place] preventive services health task force level A and B recommendations for age, sex and risks. -Advised at least 150 minutes of exercise per week and a healthy diet with avoidance of (less then 1 serving per week) processed foods, white starches, red meat, fast foods and sweets and consisting of: * 5-9 servings of fresh fruits and vegetables (not corn or potatoes) *nuts and seeds, beans *olives and olive oil *lean meats such as fish and white chicken  *whole grains -FASTING labs, studies and vaccines per orders this encounter   Amenorrhea Hot flashes Thyroid mass  Plan:  TSH, US Soft Tissue Head/Neck -we discussed possible serious and likely etiologies, workup and treatment, treatment risks and return precautions -after this discussion, Eileen [facility] opted for TSH, thyroid US, gyn exam and further eval pending these results -of course, we advised Eileen [facility]  to return or notify a doctor immediately if symptoms worsen or persist or new concerns arise.  Baker's cyst, right - -sees Eileen Martinez  Hemorrhoids, unspecified hemorrhoid type - Seeing Eileen [facility]    Orders Placed This Encounter  Procedures  . US Soft Tissue Head/Neck    Standing Status: Future     Number of Occurrences:      Standing Expiration Date: 01/25/2017    Order Specific Question:  Reason for Exam (SYMPTOM  OR DIAGNOSIS REQUIRED)    Answer:  for thyroid fullness    Order Specific Question:  Preferred imaging  location?    Answer:  [facility]  . Lipid Panel  . TSH  . Hemoglobin A1c    Patient advised to return to clinic immediately if symptoms worsen or persist or new concerns.  Patient Instructions  BEFORE YOU LEAVE: -follow up: 4-6 months or as needed -labs -urine preg test  -please see your gynecologist for exam, breast exam, pelvic exam, etc  -We placed a referral for you as discussed for the thyroid ultrasound. It usually takes about 1-2 weeks to process and schedule this referral. If you have not heard from [place] regarding this appointment in 2 weeks please contact our office.  We have ordered labs or studies at this visit. It can take up to 1-2 weeks for results and processing. IF results require follow  up or explanation, we will call you with instructions. Clinically stable results will be released to your [facility]. If you have not heard from [place] or cannot find your results in [facility] in 2 weeks please contact our office at [phone].  If you are not yet signed up for [facility], please consider signing up.   We recommend the following healthy lifestyle: 1) Small portions - eat off of salad plate instead of dinner plate 2) Eat a healthy clean diet with avoidance of (less then 1 serving per week) processed foods, sweetened drinks, white starches, red meat, fast foods and sweets and consisting of: * 5-9 servings per day of fresh or frozen fruits and vegetables (not corn or potatoes, not dried or canned) *nuts and seeds, beans *olives and olive oil *small portions of lean meats such as fish and white chicken  *small portions of whole grains 3)Get at least 150 minutes of sweaty aerobic exercise per week 4)reduce stress - counseling, meditation, relaxation to balance other aspects of your life          No Follow-up on file.  Colin Benton R., DO

## 2015-11-25 NOTE — Progress Notes (Signed)
Pre visit review using our clinic review tool, if applicable. No additional management support is needed unless otherwise documented below in the visit note. 

## 2015-11-25 NOTE — Patient Instructions (Signed)
BEFORE YOU LEAVE: -follow up: 4-6 months or as needed -labs -urine preg test  -please see your gynecologist for exam, breast exam, pelvic exam, etc  -We placed a referral for you as discussed for the thyroid ultrasound. It usually takes about 1-2 weeks to process and schedule this referral. If you have not heard from [place] regarding this appointment in 2 weeks please contact our office.  We have ordered labs or studies at this visit. It can take up to 1-2 weeks for results and processing. IF results require follow up or explanation, we will call you with instructions. Clinically stable results will be released to your [facility]. If you have not heard from [place] or cannot find your results in [facility] in 2 weeks please contact our office at [phone].  If you are not yet signed up for [facility], please consider signing up.   We recommend the following healthy lifestyle: 1) Small portions - eat off of salad plate instead of dinner plate 2) Eat a healthy clean diet with avoidance of (less then 1 serving per week) processed foods, sweetened drinks, white starches, red meat, fast foods and sweets and consisting of: * 5-9 servings per day of fresh or frozen fruits and vegetables (not corn or potatoes, not dried or canned) *nuts and seeds, beans *olives and olive oil *small portions of lean meats such as fish and white chicken  *small portions of whole grains 3)Get at least 150 minutes of sweaty aerobic exercise per week 4)reduce stress - counseling, meditation, relaxation to balance other aspects of your life

## 2015-11-28 ENCOUNTER — Ambulatory Visit
Admission: RE | Admit: 2015-11-28 | Discharge: 2015-11-28 | Disposition: A | Discharge status: Home / Self Care | Payer: BC Managed Care – PPO | Source: Ambulatory Visit | Attending: Family Medicine | Admitting: Family Medicine

## 2015-11-28 DIAGNOSIS — E079 Disorder of thyroid, unspecified: Secondary | ICD-10-CM

## 2015-11-29 ENCOUNTER — Other Ambulatory Visit: Payer: Self-pay | Admitting: *Deleted

## 2015-11-29 ENCOUNTER — Telehealth: Payer: Self-pay | Admitting: Family Medicine

## 2015-11-29 DIAGNOSIS — E041 Nontoxic single thyroid nodule: Secondary | ICD-10-CM

## 2015-11-29 NOTE — Telephone Encounter (Signed)
Please let pt know and see what her preference is. Thanks.

## 2015-11-29 NOTE — Telephone Encounter (Signed)
Dr Kim-per Neoma Laming she can enter a referral for the pt to see Dr Buddy Duty but this could take longer as she would need to send records and the doctor there will review them first.  Neoma Laming said we could have the patient call [facility] for cancellations and questioned what you would prefer?

## 2015-11-29 NOTE — Telephone Encounter (Signed)
I called the pt and informed her of the message below.  She stated she will call [facility] to see if they have any cancellations instead of seeing another doctor.

## 2015-11-29 NOTE — Telephone Encounter (Signed)
Pt state the Endocrinologist can not get her in until 12/31/15 pt would like to know if there is any other endocrinologist you can refer her to that can see her sooner.

## 2015-12-31 ENCOUNTER — Ambulatory Visit (INDEPENDENT_AMBULATORY_CARE_PROVIDER_SITE_OTHER): Payer: BC Managed Care – PPO | Admitting: Endocrinology

## 2015-12-31 ENCOUNTER — Encounter: Payer: Self-pay | Admitting: Endocrinology

## 2015-12-31 VITALS — BP 102/66 | HR 58 | Ht 68.0 in | Wt 169.0 lb

## 2015-12-31 DIAGNOSIS — R232 Flushing: Secondary | ICD-10-CM

## 2015-12-31 DIAGNOSIS — E041 Nontoxic single thyroid nodule: Secondary | ICD-10-CM | POA: Diagnosis not present

## 2015-12-31 DIAGNOSIS — E049 Nontoxic goiter, unspecified: Secondary | ICD-10-CM | POA: Diagnosis not present

## 2015-12-31 DIAGNOSIS — N951 Menopausal and female climacteric states: Secondary | ICD-10-CM | POA: Diagnosis not present

## 2015-12-31 LAB — FOLLICLE STIMULATING HORMONE: FSH: 100.8 m[IU]/mL

## 2015-12-31 NOTE — Progress Notes (Signed)
Patient ID: Eileen Martinez, female   DOB: August 08, 1970, 45 y.o.   MRN: [id number]            Reason for Appointment: Evaluation of thyroid nodule  Referring physician: Colin Benton    History of Present Illness:   The patient's thyroid nodule was first discovered on an ultrasound done for evaluation of a goiter found on routine annual exam by her PCP in 11/2015 The patient had not noticed any swelling or discomfort in her neck   The thyroid ultrasound showed the following  Bilateral nodules. 1.2 cm left lobe nodule with punctate calcifications.  She feels fairly good subjectively without any unusual fatigue  Lab Results  Component Value Date   TSH 1.68 11/25/2015      Medication List       Accurate as of 12/31/15  5:10 PM. Always use your most recent med list.          AEROCHAMBER MAX W/FLOW-VU Misc   cephALEXin 500 MG capsule Commonly known as:  KEFLEX   EPIPEN 2-PAK 0.3 mg/0.3 mL Soaj injection Generic drug:  EPINEPHrine   fluticasone 50 MCG/ACT nasal spray Commonly known as:  FLONASE Place 2 sprays into both nostrils daily.   montelukast 10 MG tablet Commonly known as:  SINGULAIR   ondansetron 4 MG disintegrating tablet Commonly known as:  ZOFRAN-ODT   phenazopyridine 200 MG tablet Commonly known as:  PYRIDIUM Take 200 mg by mouth 3 (three) times daily as needed for pain.   PROAIR HFA 108 (90 Base) MCG/ACT inhaler Generic drug:  albuterol   QNASL 80 MCG/ACT Aers Generic drug:  Beclomethasone Dipropionate   sulfacetamide 10 % ophthalmic solution Commonly known as:  BLEPH-10 Place 1 drop into both eyes every 3 (three) hours.       Allergies: No Known Allergies  Past Medical History:  Diagnosis Date  . Abdominal pain   . Abnormal Pap smear   . Blood in stool   . Chicken pox   . Elevated CA-125   . Endometriosis   . Fibroid   . Herpes   . Irregular menses   . Ovarian cyst, right    6cm  . Polyp of colon     There is no history of  radiation to the neck in childhood  Past Surgical History:  Procedure Laterality Date  . MYOMECTOMY  2007    Family History  Problem Relation Age of Onset  . Arthritis      parent  . Breast cancer Other   . Heart disease Other   . Stroke Other   . Hypertension      parent  . Hypertension Other   . Mental illness Other   . Diabetes Other   . Myasthenia gravis Mother   . Thyroid cancer Maternal Uncle     Social History:  reports that she has never smoked. She does not have any smokeless tobacco history on file. She reports that she does not drink alcohol or use drugs.   Review of Systems:  No history of recent weight change  No unusual fatigue, heat or cold intolerance  No history of recent bowel habit changes  Has asthma treated as needed with inhalers  She is concerned about not having a menstrual cycle for sometime and having significant hot flashes including at night for about 3 months per Previously followed by an gynecologist for endometriosis         Examination:   BP 102/66   Pulse (!) 58  Ht 5\' 8"  (1.727 m)   Wt 169 lb (76.7 kg)   BMI 25.70 kg/m    General Appearance:  well-looking, averagely built and nourished        Eyes: No abnormal prominence or swelling of the eyes          THYROID:  Right lobe of the thyroid is enlarged twice normal, soft and smooth. She has about a 2 cm midline nodule and a 1.5 cm left sided nodule present.  There is no lymphadenopathy in the neck  No stridor  Heart sounds normal Lungs clear Abdomen exam not indicated Reflexes at biceps are normal.  Skin: No rash or lesions  Extremities: No edema  Assessment/Plan:  Thyroid nodule: Although her dominant nodule on the left is 1.2 cm only it does appear to have calcifications and radiologist has recommended needle aspiration for evaluation.  She does appear to have a family history of probable thyroid cancer also.  Discussed evaluation of thyroid nodules with a needle  aspiration biopsy as well as the fact that at least 90% of nodules are benign. Also discussed that the last patient may not show conclusive cytology in 20% of patients and further genetic studies may be needed  GOITER: She has a soft goiter especially on the right side and this may be secondary to Hashimoto's thyroiditis Will check her peroxidase antibody  ?  Early menopause.  Not clear this is related to her endometriosis or possibly autoimmune premature ovarian failure will evaluate with [facility] and forward information to her gynecologist      [facility] 12/31/2015

## 2016-01-01 LAB — THYROID PEROXIDASE ANTIBODY: THYROID PEROXIDASE ANTIBODY: 13 [IU]/mL (ref 0–34)

## 2016-01-01 NOTE — Progress Notes (Signed)
Please let patient know that the thyroid antibody test is normal but her hormone test indicates full fledged menopause.  Have sent the lab to her gynecologist and she needs to call them

## 2016-01-08 ENCOUNTER — Other Ambulatory Visit (HOSPITAL_COMMUNITY)
Admission: RE | Admit: 2016-01-08 | Discharge: 2016-01-08 | Disposition: A | Discharge status: Home / Self Care | Payer: BC Managed Care – PPO | Source: Ambulatory Visit | Attending: General Surgery | Admitting: General Surgery

## 2016-01-08 ENCOUNTER — Ambulatory Visit
Admission: RE | Admit: 2016-01-08 | Discharge: 2016-01-08 | Disposition: A | Discharge status: Home / Self Care | Payer: BC Managed Care – PPO | Source: Ambulatory Visit | Attending: Endocrinology | Admitting: Endocrinology

## 2016-01-08 DIAGNOSIS — E041 Nontoxic single thyroid nodule: Secondary | ICD-10-CM

## 2016-01-13 NOTE — Progress Notes (Signed)
Please let patient know that the thyroid biopsy shows a benign nodule, no treatment now, need to schedule follow-up in 6 months with TSH test

## 2016-03-29 NOTE — Progress Notes (Signed)
HPI:  Katonya Allen-Norris is a pleasant 45 yo here for follow up. PMH significant for BMI 26, mild hyperlipidemia, Asthma, Allergies, Hemorrhoids (sees [facility]) Endometriosis (sees gynecologist), bakers cyst, thyroid goiter (saw Dr. Dwyane Dee). Reports she had a negative biopsy. It appears she is due for a mammogram and her flu shot. Wt 172->169.5. She has been exercising and eating healthier. Feels good. She sees her gynecologist next week for her mammogram and gyn physical. Reports hot flashes have resolved with a healthy lifestyle. No other concerns today. Declines flu shot.  ROS: See pertinent positives and negatives per HPI.  Past Medical History:  Diagnosis Date  . Abdominal pain   . Abnormal Pap smear   . Blood in stool   . Chicken pox   . Elevated CA-125   . Endometriosis   . Fibroid   . Herpes   . Irregular menses   . Ovarian cyst, right    6cm  . Polyp of colon     Past Surgical History:  Procedure Laterality Date  . MYOMECTOMY  2007    Family History  Problem Relation Age of Onset  . Arthritis      parent  . Breast cancer Other   . Heart disease Other   . Stroke Other   . Hypertension      parent  . Hypertension Other   . Mental illness Other   . Diabetes Other   . Myasthenia gravis Mother   . Thyroid cancer Maternal Uncle     Social History   Social History  . Marital status: Single    Spouse name: N/A  . Number of children: N/A  . Years of education: N/A   Social History Main Topics  . Smoking status: Never Smoker  . Smokeless tobacco: None  . Alcohol use No  . Drug use: No  . Sexual activity: Yes   Other Topics Concern  . None   Social History Narrative  . None     Current Outpatient Prescriptions:  .  BREO ELLIPTA 100-25 MCG/INH AEPB, , Disp: , Rfl:  .  EPIPEN 2-PAK 0.3 MG/0.3ML DEVI, , Disp: , Rfl:  .  montelukast (SINGULAIR) 10 MG tablet, , Disp: , Rfl:  .  ondansetron (ZOFRAN-ODT) 4 MG disintegrating tablet, , Disp: , Rfl:  .   QNASL 80 MCG/ACT AERS, , Disp: , Rfl:  .  Spacer/Aero-Holding Chambers (AEROCHAMBER MAX W/FLOW-VU) MISC, , Disp: , Rfl:   EXAM:  Vitals:   03/30/16 0848  BP: 100/70  Pulse: (!) 55  Temp: 98.1 F (36.7 C)    Body mass index is 25.77 kg/m.  GENERAL: vitals reviewed and listed above, alert, oriented, appears well hydrated and in no acute distress  HEENT: atraumatic, conjunttiva clear, no obvious abnormalities on inspection of external nose and ears   LUNGS: clear to auscultation bilaterally, no wheezes, rales or rhonchi, good air movement  CV: HRRR, no peripheral edema  MS: moves all extremities without noticeable abnormality  PSYCH: pleasant and cooperative, no obvious depression or anxiety  ASSESSMENT AND PLAN:  Discussed the following assessment and plan:  BMI 25.0-25.9,adult  Goiter, euthyroid  Endometriosis, followed by Dr. Julien Girt and Dr. Skeet Latch  Mild hyperlipidemia  -discussed lifestyle and encouraged to continue -she plans to see her gynecologist for her mammogram next week and to discuss the perimenopause -recheck lipids at next physical -Patient advised to return or notify a doctor immediately if symptoms worsen or persist or new concerns arise.  Patient Instructions  BEFORE YOU LEAVE: -follow up: Physical in July 2018  We recommend the following healthy lifestyle for LIFE: 1) Small portions.   Tip: eat off of a salad plate instead of a dinner plate.  Tip: if you need more or a snack choose fruits, veggies and/or a handful of nuts or seeds.  2) Eat a healthy clean diet.  * Tip: Avoid (less then 1 serving per week): processed foods, sweets, sweetened drinks, white starches (rice, flour, bread, potatoes, pasta, etc), red meat, fast foods, butter  *Tip: CHOOSE instead   * 5-9 servings per day of fresh or frozen fruits and vegetables (but not corn, potatoes, bananas, canned or dried fruit)   *nuts and seeds, beans   *olives and olive oil   *small  portions of lean meats such as fish and white chicken    *small portions of whole grains  3)Get at least 150 minutes of sweaty aerobic exercise per week.  4)Reduce stress - consider counseling, meditation and relaxation to balance other aspects of your life.     Colin Benton R., DO

## 2016-03-30 ENCOUNTER — Encounter: Payer: Self-pay | Admitting: Family Medicine

## 2016-03-30 ENCOUNTER — Ambulatory Visit (INDEPENDENT_AMBULATORY_CARE_PROVIDER_SITE_OTHER): Payer: BC Managed Care – PPO | Admitting: Family Medicine

## 2016-03-30 VITALS — BP 100/70 | HR 55 | Temp 98.1°F | Ht 68.0 in | Wt 169.5 lb

## 2016-03-30 DIAGNOSIS — E049 Nontoxic goiter, unspecified: Secondary | ICD-10-CM

## 2016-03-30 DIAGNOSIS — N809 Endometriosis, unspecified: Secondary | ICD-10-CM

## 2016-03-30 DIAGNOSIS — E785 Hyperlipidemia, unspecified: Secondary | ICD-10-CM

## 2016-03-30 DIAGNOSIS — Z6825 Body mass index (BMI) 25.0-25.9, adult: Secondary | ICD-10-CM

## 2016-03-30 NOTE — Patient Instructions (Addendum)
BEFORE YOU LEAVE: -follow up: Physical in July 2018  I am glad you are doing so well! Keep up the healthy lifestyle!  We recommend the following healthy lifestyle for LIFE: 1) Small portions.   Tip: eat off of a salad plate instead of a dinner plate.  Tip: if you need more or a snack choose fruits, veggies and/or a handful of nuts or seeds.  2) Eat a healthy clean diet.  * Tip: Avoid (less then 1 serving per week): processed foods, sweets, sweetened drinks, white starches (rice, flour, bread, potatoes, pasta, etc), red meat, fast foods, butter  *Tip: CHOOSE instead   * 5-9 servings per day of fresh or frozen fruits and vegetables (but not corn, potatoes, bananas, canned or dried fruit)   *nuts and seeds, beans   *olives and olive oil   *small portions of lean meats such as fish and white chicken    *small portions of whole grains  3)Get at least 150 minutes of sweaty aerobic exercise per week.  4)Reduce stress - consider counseling, meditation and relaxation to balance other aspects of your life.

## 2016-03-30 NOTE — Progress Notes (Signed)
Pre visit review using our clinic review tool, if applicable. No additional management support is needed unless otherwise documented below in the visit note. 

## 2016-05-08 ENCOUNTER — Ambulatory Visit (INDEPENDENT_AMBULATORY_CARE_PROVIDER_SITE_OTHER): Payer: BC Managed Care – PPO | Admitting: Family Medicine

## 2016-05-08 DIAGNOSIS — Z0289 Encounter for other administrative examinations: Secondary | ICD-10-CM

## 2016-05-08 DIAGNOSIS — R21 Rash and other nonspecific skin eruption: Secondary | ICD-10-CM

## 2016-05-08 MED ORDER — VALACYCLOVIR HCL 1 G PO TABS: 1000.0000 mg | ORAL_TABLET | Freq: Three times a day (TID) | ORAL | 0 refills | Status: DC

## 2016-05-08 NOTE — Progress Notes (Signed)
HPI:  Eileen Martinez is here for an acute visit for skin rash. This developed acutely on the left shoulder 2 days ago. The rash has been slightly itchy and slightly painful. She is worried about shingles. She did visit some other homes during the time that this started. She does not have a rash anywhere else on the body. She felt mildly chilled yesterday. Denies fevers, malaise, vomiting, nausea or any other rash.  ROS: See pertinent positives and negatives per HPI.  Past Medical History:  Diagnosis Date  . Abdominal pain   . Abnormal Pap smear   . Blood in stool   . Chicken pox   . Elevated CA-125   . Endometriosis   . Fibroid   . Herpes   . Irregular menses   . Ovarian cyst, right    6cm  . Polyp of colon     Past Surgical History:  Procedure Laterality Date  . MYOMECTOMY  2007    Family History  Problem Relation Age of Onset  . Arthritis      parent  . Breast cancer Other   . Heart disease Other   . Stroke Other   . Hypertension      parent  . Hypertension Other   . Mental illness Other   . Diabetes Other   . Myasthenia gravis Mother   . Thyroid cancer Maternal Uncle     Social History   Social History  . Marital status: Single    Spouse name: N/A  . Number of children: N/A  . Years of education: N/A   Social History Main Topics  . Smoking status: Never Smoker  . Smokeless tobacco: Not on file  . Alcohol use No  . Drug use: No  . Sexual activity: Yes   Other Topics Concern  . Not on file   Social History Narrative  . No narrative on file     Current Outpatient Prescriptions:  .  BREO ELLIPTA 100-25 MCG/INH AEPB, , Disp: , Rfl:  .  EPIPEN 2-PAK 0.3 MG/0.3ML DEVI, , Disp: , Rfl:  .  montelukast (SINGULAIR) 10 MG tablet, , Disp: , Rfl:  .  ondansetron (ZOFRAN-ODT) 4 MG disintegrating tablet, , Disp: , Rfl:  .  QNASL 80 MCG/ACT AERS, , Disp: , Rfl:  .  Spacer/Aero-Holding Chambers (AEROCHAMBER MAX W/FLOW-VU) MISC, , Disp: , Rfl:  .   valACYclovir (VALTREX) 1000 MG tablet, Take 1 tablet (1,000 mg total) by mouth 3 (three) times daily., Disp: 21 tablet, Rfl: 0  EXAM:  There were no vitals filed for this visit.  There is no height or weight on file to calculate BMI.  GENERAL: vitals reviewed and listed above, alert, oriented, appears well hydrated and in no acute distress  HEENT: atraumatic, conjunttiva clear, no obvious abnormalities on inspection of external nose and ears  NECK: no obvious masses on inspection  LUNGS: clear to auscultation bilaterally, no wheezes, rales or rhonchi, good air movement  CV: HRRR, no peripheral edema  SKIN: Erythematous papulles distributed in a streaky pattern across the left shoulder - a few lesions appear mildly vesicular  MS: moves all extremities without noticeable abnormality  PSYCH: pleasant and cooperative, no obvious depression or anxiety  ASSESSMENT AND PLAN:  Discussed the following assessment and plan:  Rash and nonspecific skin eruption  -While this could be insect bites, with some mild vesicular formation secondary to excoriation and local reaction, we opted to go ahead and start Valtrex in case this is shingles. -  She will use a mild topical steroid and antihistamine as needed for itching -Patient advised to return or notify a doctor immediately if symptoms worsen or persist or new concerns arise.  Patient Instructions  Please take the medication as prescribed (valtrex) 1000mg  three times daily for 7 days.  Can use benadryl and or hydrocortisone cream per instructions as needed for the itch.  Seek care if worsening, new concerns, or not resolving with treatment.   Colin Benton R., DO

## 2016-05-08 NOTE — Patient Instructions (Signed)
Please take the medication as prescribed (valtrex) 1000mg  three times daily for 7 days.  Can use benadryl and or hydrocortisone cream per instructions as needed for the itch.  Seek care if worsening, new concerns, or not resolving with treatment.

## 2016-05-15 ENCOUNTER — Encounter: Payer: Self-pay | Admitting: Family Medicine

## 2016-05-15 ENCOUNTER — Ambulatory Visit (INDEPENDENT_AMBULATORY_CARE_PROVIDER_SITE_OTHER): Payer: BC Managed Care – PPO | Admitting: Family Medicine

## 2016-05-15 VITALS — BP 116/80 | HR 76 | Temp 97.6°F | Ht 68.0 in | Wt 169.5 lb

## 2016-05-15 DIAGNOSIS — N3001 Acute cystitis with hematuria: Secondary | ICD-10-CM

## 2016-05-15 DIAGNOSIS — R300 Dysuria: Secondary | ICD-10-CM

## 2016-05-15 LAB — POCT URINALYSIS DIPSTICK
BILIRUBIN UA: NEGATIVE
Glucose, UA: NEGATIVE
KETONES UA: NEGATIVE
Nitrite, UA: NEGATIVE
SPEC GRAV UA: 1.01
Urobilinogen, UA: 0.2
pH, UA: 7.5

## 2016-05-15 MED ORDER — NITROFURANTOIN MONOHYD MACRO 100 MG PO CAPS: 100.0000 mg | ORAL_CAPSULE | Freq: Two times a day (BID) | ORAL | 0 refills | Status: DC

## 2016-05-15 NOTE — Patient Instructions (Signed)
Go get the antibiotic and start today.  Can also use Azo.  Seek care immediately if worsening, new concerns or not improving with treatment.

## 2016-05-15 NOTE — Progress Notes (Signed)
HPI:  Acute visit for Dysuria: -started today -symptoms include frequency, urgency, burning with urination -denies: abd/flank/pelvic pain, fever, nausea, vomiting, vag discharge -reports hx uti 1 year ago and reports gyn told her she is starting menopause - periods irr - FDLMP 2 weeks ago  ROS: See pertinent positives and negatives per HPI.  Past Medical History:  Diagnosis Date  . Abdominal pain   . Abnormal Pap smear   . Blood in stool   . Chicken pox   . Elevated CA-125   . Endometriosis   . Fibroid   . Herpes   . Irregular menses   . Ovarian cyst, right    6cm  . Polyp of colon     Past Surgical History:  Procedure Laterality Date  . MYOMECTOMY  2007    Family History  Problem Relation Age of Onset  . Arthritis      parent  . Breast cancer Other   . Heart disease Other   . Stroke Other   . Hypertension      parent  . Hypertension Other   . Mental illness Other   . Diabetes Other   . Myasthenia gravis Mother   . Thyroid cancer Maternal Uncle     Social History   Social History  . Marital status: Single    Spouse name: N/A  . Number of children: N/A  . Years of education: N/A   Social History Main Topics  . Smoking status: Never Smoker  . Smokeless tobacco: None  . Alcohol use No  . Drug use: No  . Sexual activity: Yes   Other Topics Concern  . None   Social History Narrative  . None     Current Outpatient Prescriptions:  .  BREO ELLIPTA 100-25 MCG/INH AEPB, , Disp: , Rfl:  .  EPIPEN 2-PAK 0.3 MG/0.3ML DEVI, , Disp: , Rfl:  .  montelukast (SINGULAIR) 10 MG tablet, , Disp: , Rfl:  .  ondansetron (ZOFRAN-ODT) 4 MG disintegrating tablet, , Disp: , Rfl:  .  QNASL 80 MCG/ACT AERS, , Disp: , Rfl:  .  Spacer/Aero-Holding Chambers (AEROCHAMBER MAX W/FLOW-VU) MISC, , Disp: , Rfl:  .  valACYclovir (VALTREX) 1000 MG tablet, Take 1 tablet (1,000 mg total) by mouth 3 (three) times daily., Disp: 21 tablet, Rfl: 0 .  nitrofurantoin,  macrocrystal-monohydrate, (MACROBID) 100 MG capsule, Take 1 capsule (100 mg total) by mouth 2 (two) times daily., Disp: 14 capsule, Rfl: 0  EXAM:  Vitals:   05/15/16 1604  BP: 116/80  Pulse: 76  Temp: 97.6 F (36.4 C)    Body mass index is 25.77 kg/m.  GENERAL: vitals reviewed and listed above, alert, oriented, appears well hydrated and in no acute distress  HEENT: atraumatic, conjunttiva clear, no obvious abnormalities on inspection of external nose and ears  NECK: no obvious masses on inspection  LUNGS: clear to auscultation bilaterally, no wheezes, rales or rhonchi, good air movement  CV: HRRR, no peripheral edema  ABD: soft, NTTP, no CVA TTP  MS: moves all extremities without noticeable abnormality  PSYCH: pleasant and cooperative, no obvious depression or anxiety  ASSESSMENT AND PLAN:  Discussed the following assessment and plan:  Dysuria - Plan: POC Urinalysis Dipstick, Culture, Urine  Acute cystitis with hematuria  -udip + symptoms c/w with likely UTI - opted for tx with abx, azo for symptoms -culture pedning -Patient advised to return or notify a doctor immediately if symptoms worsen or persist or new concerns arise.  Patient Instructions  Go get the antibiotic and start today.  Can also use Azo.  Seek care immediately if worsening, new concerns or not improving with treatment.   Colin Benton R., DO

## 2016-05-15 NOTE — Progress Notes (Signed)
Pre visit review using our clinic review tool, if applicable. No additional management support is needed unless otherwise documented below in the visit note. 

## 2016-05-17 LAB — URINE CULTURE

## 2016-05-19 ENCOUNTER — Telehealth: Payer: Self-pay | Admitting: *Deleted

## 2016-05-19 ENCOUNTER — Ambulatory Visit (INDEPENDENT_AMBULATORY_CARE_PROVIDER_SITE_OTHER)
Admission: RE | Admit: 2016-05-19 | Discharge: 2016-05-19 | Disposition: A | Discharge status: Home / Self Care | Payer: BC Managed Care – PPO | Source: Ambulatory Visit | Attending: Family Medicine | Admitting: Family Medicine

## 2016-05-19 DIAGNOSIS — R300 Dysuria: Secondary | ICD-10-CM

## 2016-05-19 DIAGNOSIS — N3001 Acute cystitis with hematuria: Secondary | ICD-10-CM | POA: Diagnosis not present

## 2016-05-19 DIAGNOSIS — N200 Calculus of kidney: Secondary | ICD-10-CM

## 2016-05-19 NOTE — Addendum Note (Signed)
Addended by: Lahoma Crocker A on: 05/19/2016 08:00 AM   Modules accepted: Orders

## 2016-05-19 NOTE — Telephone Encounter (Signed)
Eileen Martinez called from CT at [facility] to let Dr Maudie Mercury know the CT results are now available for the pt as she does have a tiny stone.

## 2016-05-19 NOTE — Telephone Encounter (Signed)
I called the pt and informed her of the message below and she is still having symptoms.  She is aware the referral was placed to the urologist and someone will call with appt info.

## 2016-05-19 NOTE — Telephone Encounter (Signed)
Please call pt. Is she still having symptoms? Has tiny stone in one kidney that would not be causing symptoms - but perhaps she passed one that did cause her symptoms. Let see if we can get her in with urology to eval further if still having symptoms. If symptoms resolved schedule follow up in 1 month. Thanks.

## 2016-05-19 NOTE — Addendum Note (Signed)
Addended by: Agnes Lawrence on: 05/19/2016 04:13 PM   Modules accepted: Orders

## 2016-05-20 ENCOUNTER — Other Ambulatory Visit (INDEPENDENT_AMBULATORY_CARE_PROVIDER_SITE_OTHER): Payer: BC Managed Care – PPO

## 2016-05-20 DIAGNOSIS — R300 Dysuria: Secondary | ICD-10-CM

## 2016-05-20 DIAGNOSIS — N3001 Acute cystitis with hematuria: Secondary | ICD-10-CM | POA: Diagnosis not present

## 2016-05-20 LAB — BASIC METABOLIC PANEL
BUN: 16 mg/dL (ref 6–23)
CALCIUM: 9.1 mg/dL (ref 8.4–10.5)
CHLORIDE: 102 meq/L (ref 96–112)
CO2: 30 mEq/L (ref 19–32)
CREATININE: 0.75 mg/dL (ref 0.40–1.20)
GFR: 107.32 mL/min (ref >60.00)
Glucose, Bld: 100 mg/dL — ABNORMAL HIGH (ref 70–99)
Potassium: 4.4 mEq/L (ref 3.5–5.1)
Sodium: 137 mEq/L (ref 135–145)

## 2016-09-28 ENCOUNTER — Ambulatory Visit (INDEPENDENT_AMBULATORY_CARE_PROVIDER_SITE_OTHER): Payer: BC Managed Care – PPO | Admitting: Family Medicine

## 2016-09-28 ENCOUNTER — Encounter: Payer: Self-pay | Admitting: Family Medicine

## 2016-09-28 VITALS — BP 120/70 | HR 64 | Resp 12 | Ht 68.0 in | Wt 176.5 lb

## 2016-09-28 DIAGNOSIS — R310 Gross hematuria: Secondary | ICD-10-CM

## 2016-09-28 DIAGNOSIS — N390 Urinary tract infection, site not specified: Secondary | ICD-10-CM | POA: Diagnosis not present

## 2016-09-28 DIAGNOSIS — R319 Hematuria, unspecified: Secondary | ICD-10-CM

## 2016-09-28 DIAGNOSIS — R399 Unspecified symptoms and signs involving the genitourinary system: Secondary | ICD-10-CM | POA: Diagnosis not present

## 2016-09-28 LAB — POCT URINALYSIS DIPSTICK
Glucose, UA: NEGATIVE
KETONES UA: NEGATIVE
NITRITE UA: POSITIVE
PH UA: 5.5 (ref 5.0–8.0)
PROTEIN UA: NEGATIVE

## 2016-09-28 MED ORDER — NITROFURANTOIN MONOHYD MACRO 100 MG PO CAPS: 100.0000 mg | ORAL_CAPSULE | Freq: Two times a day (BID) | ORAL | 0 refills | Status: AC

## 2016-09-28 NOTE — Patient Instructions (Signed)
  [name] I have seen you today for an acute visit.  A few things to remember from today's visit:   UTI symptoms - Plan: POCT urinalysis dipstick, Culture, Urine  Urinary tract infection with hematuria, site unspecified - Plan: nitrofurantoin, macrocrystal-monohydrate, (MACROBID) 100 MG capsule  Gross hematuria     Adequate fluid intake, avoid holding urine for long hours, and over the counter Vit C OR cranberry capsules might help.  Today we will treat empirically with antibiotic, which we might need to change when urine culture comes back depending of bacteria susceptibility.  Seek immediate medical attention if severe abdominal pain, vomiting, fever/chills, or worsening symptoms. F/U if symptomatic are not any better after 2-3 days of antibiotic treatment.  Medications prescribed today are intended for short period of time and will not be refill upon request, a follow up appointment might be necessary to discuss continuation of of treatment if appropriate.   In general please monitor for signs of worsening symptoms and seek immediate medical attention if any concerning.

## 2016-09-28 NOTE — Progress Notes (Signed)
HPI:   [name] is a 46 y.o. female, who is here today complaining of 2-3 days of urinary symptoms.   Dysuria: Yes, burning sensation. Urinary frequency: Yes Urinary urgency: Yes Incontinence: Denies Gross hematuria: Yes, once when symptoms first started. She has had this with prior UTI's.  Denies tobacco use. No Hx of nephrolithiasis.  Abdominal pain: Yesterday she had mild achy suprapubic pain, resolved.  Nausea or vomiting: Denies  Abnormal vaginal bleeding or discharge: Denies  LMP:3 weeks ago Sexual activity: Yes Hx of UTI: Yes, reporting recurrent UTI's Ucx 05/2016 contamination.  OTC medications for this problem: Azo,cranberry juice,and increased water intake. Symptoms improved.    Review of Systems  Constitutional: Positive for chills. Negative for activity change, appetite change, fatigue and fever.  Cardiovascular: Negative for leg swelling.  Gastrointestinal: Positive for abdominal pain. Negative for blood in stool, nausea and vomiting.  Endocrine: Negative for polydipsia and polyphagia.  Genitourinary: Positive for dysuria, frequency, hematuria and urgency. Negative for decreased urine volume, vaginal bleeding and vaginal discharge.  Musculoskeletal: Negative for back pain and myalgias.  Skin: Negative for pallor and rash.  Neurological: Negative for syncope and headaches.     Current Outpatient Prescriptions on File Prior to Visit  Medication Sig Dispense Refill  . BREO ELLIPTA 100-25 MCG/INH AEPB     . EPIPEN 2-PAK 0.3 MG/0.3ML DEVI     . montelukast (SINGULAIR) 10 MG tablet     . ondansetron (ZOFRAN-ODT) 4 MG disintegrating tablet     . QNASL 80 MCG/ACT AERS     . Spacer/Aero-Holding Chambers (AEROCHAMBER MAX W/FLOW-VU) MISC     . valACYclovir (VALTREX) 1000 MG tablet Take 1 tablet (1,000 mg total) by mouth 3 (three) times daily. 21 tablet 0   No current facility-administered medications on file prior to visit.      Past  Medical History:  Diagnosis Date  . Abdominal pain   . Abnormal Pap smear   . Blood in stool   . Chicken pox   . Elevated CA-125   . Endometriosis   . Fibroid   . Herpes   . Irregular menses   . Ovarian cyst, right    6cm  . Polyp of colon    No Known Allergies  Social History   Social History  . Marital status: Single    Spouse name: N/A  . Number of children: N/A  . Years of education: N/A   Social History Main Topics  . Smoking status: Never Smoker  . Smokeless tobacco: Never Used  . Alcohol use No  . Drug use: No  . Sexual activity: Yes   Other Topics Concern  . None   Social History Narrative  . None    Vitals:   09/28/16 0815  BP: 120/70  Pulse: 64  Resp: 12   Body mass index is 26.84 kg/m.   Physical Exam  Nursing note and vitals reviewed. Constitutional: She is oriented to person, place, and time. She appears well-developed and well-nourished. No distress.  HENT:  Head: Atraumatic.  Mouth/Throat: Oropharynx is clear and moist. Mucous membranes are dry (mild).  Eyes: Conjunctivae are normal.  Cardiovascular: Normal rate and regular rhythm.   Respiratory: Effort normal and breath sounds normal. No respiratory distress.  GI: Soft. She exhibits no mass. There is no tenderness. There is no CVA tenderness.  Musculoskeletal: She exhibits no edema or tenderness.  Neurological: She is alert and oriented to person, place, and time. Gait normal.  Skin: Skin is warm. No rash noted. No erythema.  Psychiatric: Her speech is normal. Her mood appears anxious.  Well groomed, good eye contact.     ASSESSMENT AND PLAN:   Eileen Martinez was seen today for urinary tract infection.  Diagnoses and all orders for this visit:  UTI symptoms -     POCT urinalysis dipstick -     Culture, Urine  Urinary tract infection with hematuria, site unspecified -     nitrofurantoin, macrocrystal-monohydrate, (MACROBID) 100 MG capsule; Take 1 capsule (100 mg total) by mouth 2  (two) times daily.  Gross hematuria   Urine dipstick abnormal. Other possible etiologies for some of reported symptoms discussed.  Ucx ordered.   Empiric abx treatment started today and will be tailored according to Ucx results and susceptibility report. Because gross hematuria, U/A could be considered next OV with PCP.  Clearly instructed about warning signs. F/U if symptoms persist.    -[name] was advised to return or notify a doctor immediately if symptoms worsen or persist or new concerns arise.      Eileen G. Martinique, MD  [facility]. [place] office.

## 2016-09-30 LAB — URINE CULTURE

## 2016-10-01 ENCOUNTER — Encounter: Payer: Self-pay | Admitting: Family Medicine

## 2016-11-19 ENCOUNTER — Encounter: Payer: BC Managed Care – PPO | Admitting: Family Medicine

## 2016-12-04 ENCOUNTER — Encounter: Payer: BC Managed Care – PPO | Admitting: Family Medicine

## 2016-12-07 NOTE — Progress Notes (Deleted)
HPI:  Here for CPE: Due for follow up with Dr. Dwyane Dee (TSH), labs, ? mammo - sees gyn  -Concerns and/or follow up today:  PMH significant for BMI 26, mild hyperlipidemia, Asthma, Allergies, Hemorrhoids (sees [facility]) Endometriosis (sees gynecologist), bakers cyst, thyroid goiter (sees Dr. Dwyane Dee).  -Diet: variety of foods, balance and well rounded, larger portion sizes  -Exercise: no regular exercise  -Taking folic acid, vitamin D or calcium: no  -Diabetes and Dyslipidemia Screening:  -Hx of HTN: no  -Vaccines: UTD  -pap history: does with gyn  -FDLMP:  -sexual activity: yes, female partner, no new partners  -wants STI testing (Hep C if born [birth year]): no  -FH breast, colon or ovarian ca: see FH Last mammogram: does breast exam and breast ca screening with gyn Last colon cancer screening: sees GI, N/A   -Alcohol, Tobacco, drug use: see social history  Review of Systems - no fevers, unintentional weight loss, vision loss, hearing loss, chest pain, sob, hemoptysis, melena, hematochezia, hematuria, genital discharge, changing or concerning skin lesions, bleeding, bruising, loc, thoughts of self harm or SI  Past Medical History:  Diagnosis Date  . Abdominal pain   . Abnormal Pap smear   . Blood in stool   . Chicken pox   . Elevated CA-125   . Endometriosis   . Fibroid   . Herpes   . Irregular menses   . Ovarian cyst, right    6cm  . Polyp of colon     Past Surgical History:  Procedure Laterality Date  . MYOMECTOMY  2007    Family History  Problem Relation Age of Onset  . Arthritis Unknown        parent  . Breast cancer Other   . Heart disease Other   . Stroke Other   . Hypertension Unknown        parent  . Hypertension Other   . Mental illness Other   . Diabetes Other   . Myasthenia gravis Mother   . Thyroid cancer Maternal Uncle     Social History   Social History  . Marital status: Single    Spouse name: N/A  . Number of children: N/A  .  Years of education: N/A   Social History Main Topics  . Smoking status: Never Smoker  . Smokeless tobacco: Never Used  . Alcohol use No  . Drug use: No  . Sexual activity: Yes   Other Topics Concern  . Not on file   Social History Narrative  . No narrative on file     Current Outpatient Prescriptions:  .  BREO ELLIPTA 100-25 MCG/INH AEPB, , Disp: , Rfl:  .  EPIPEN 2-PAK 0.3 MG/0.3ML DEVI, , Disp: , Rfl:  .  montelukast (SINGULAIR) 10 MG tablet, , Disp: , Rfl:  .  ondansetron (ZOFRAN-ODT) 4 MG disintegrating tablet, , Disp: , Rfl:  .  QNASL 80 MCG/ACT AERS, , Disp: , Rfl:  .  Spacer/Aero-Holding Chambers (AEROCHAMBER MAX W/FLOW-VU) MISC, , Disp: , Rfl:  .  valACYclovir (VALTREX) 1000 MG tablet, Take 1 tablet (1,000 mg total) by mouth 3 (three) times daily., Disp: 21 tablet, Rfl: 0  EXAM:  There were no vitals filed for this visit.  GENERAL: vitals reviewed and listed below, alert, oriented, appears well hydrated and in no acute distress  HEENT: head atraumatic, PERRLA, normal appearance of eyes, ears, nose and mouth. moist mucus membranes.  NECK: supple, no masses or lymphadenopathy  LUNGS: clear to auscultation bilaterally, no  rales, rhonchi or wheeze  CV: HRRR, no peripheral edema or cyanosis, normal pedal pulses  ABDOMEN: bowel sounds normal, soft, non tender to palpation, no masses, no rebound or guarding  SKIN: no rash or abnormal lesions  MS: normal gait, moves all extremities normally  NEURO: normal gait, speech and thought processing grossly intact, muscle tone grossly intact throughout  PSYCH: normal affect, pleasant and cooperative  ASSESSMENT AND PLAN:  Discussed the following assessment and plan:  There are no diagnoses linked to this encounter.  -Discussed and advised all [place] preventive services health task force level A and B recommendations for age, sex and risks.  -Advised at least 150 minutes of exercise per week and a healthy diet with  avoidance of (less then 1 serving per week) processed foods, white starches, red meat, fast foods and sweets and consisting of: * 5-9 servings of fresh fruits and vegetables (not corn or potatoes) *nuts and seeds, beans *olives and olive oil *lean meats such as fish and white chicken  *whole grains  -labs, studies and vaccines per orders this encounter  No orders of the defined types were placed in this encounter.   Patient advised to return to clinic immediately if symptoms worsen or persist or new concerns.  There are no Patient Instructions on file for this visit.  No Follow-up on file.  Colin Benton R., DO

## 2016-12-08 ENCOUNTER — Encounter: Payer: Self-pay | Admitting: Family Medicine

## 2016-12-08 DIAGNOSIS — Z0289 Encounter for other administrative examinations: Secondary | ICD-10-CM

## 2017-01-28 ENCOUNTER — Encounter: Payer: Self-pay | Admitting: Family Medicine

## 2017-05-20 ENCOUNTER — Encounter: Payer: Self-pay | Admitting: Family Medicine

## 2017-08-16 ENCOUNTER — Encounter: Payer: Self-pay | Admitting: Obstetrics and Gynecology

## 2017-08-20 IMAGING — US US THYROID BIOPSY
1 series · 13 of 13 positions shown · non-contrast
Comparison: Ultrasound on 11/28/2015

INDICATION: Nodule felt on physical exam confirmed by follow-up ultrasound.
Request made for thyroid biopsy with AFIRMA.

EXAM:
ULTRASOUND GUIDED NEEDLE ASPIRATE BIOPSY OF THE THYROID GLAND

[Series 1: us thyroid biopsy · 0.05mm/px · 13 acquisitions, 13 frames shown]
[im 1/13]
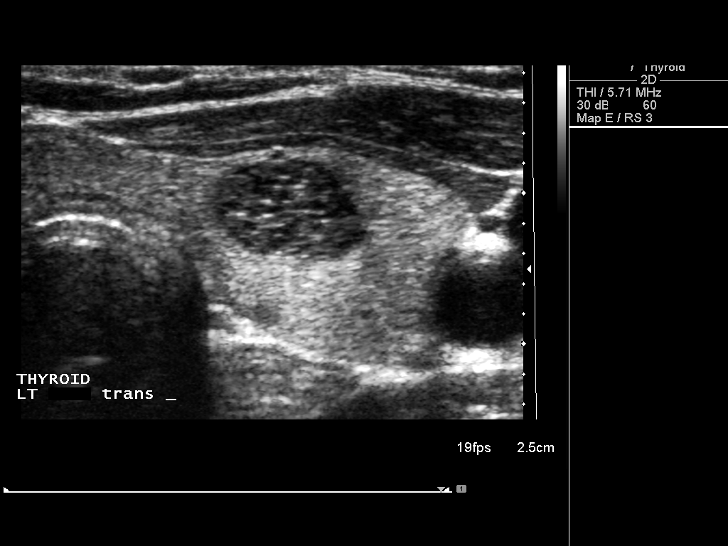
[im 2/13]
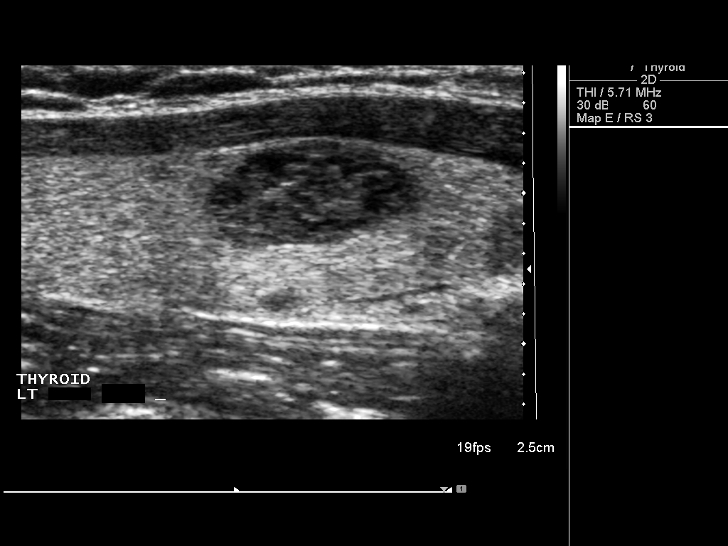
[im 3/13]
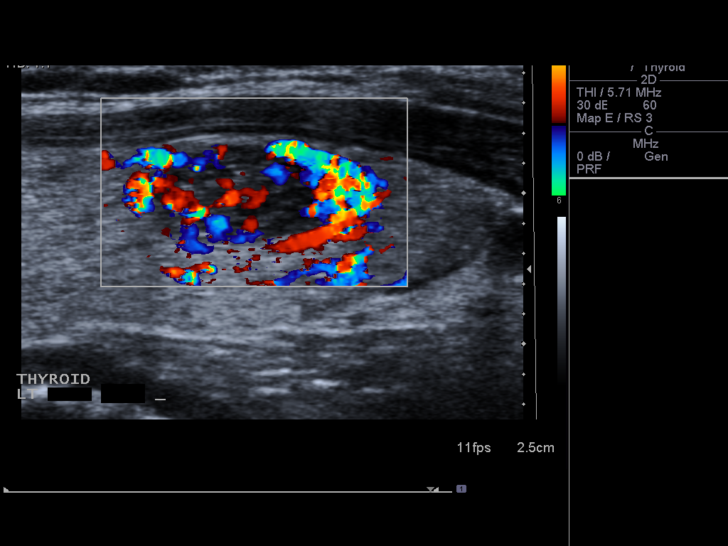
[im 4/13]
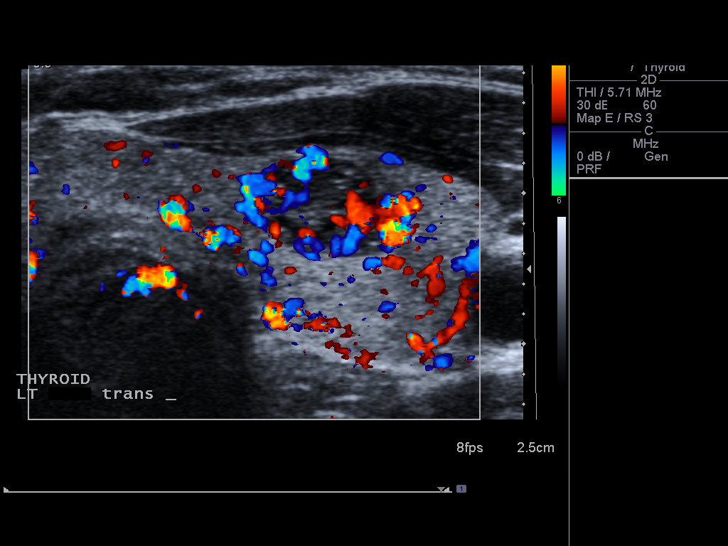
[im 5/13]
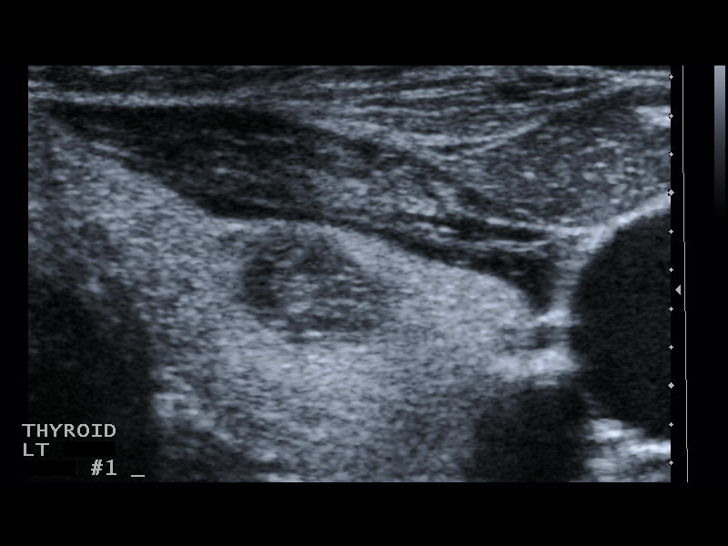
[im 6/13]
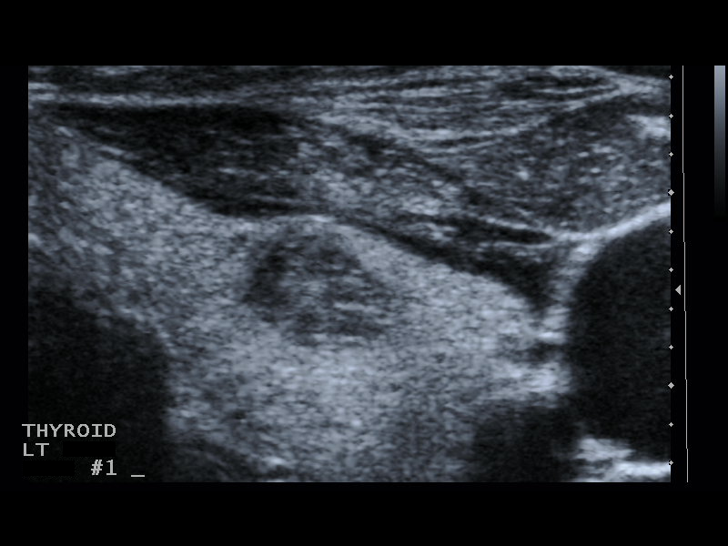
[im 7/13]
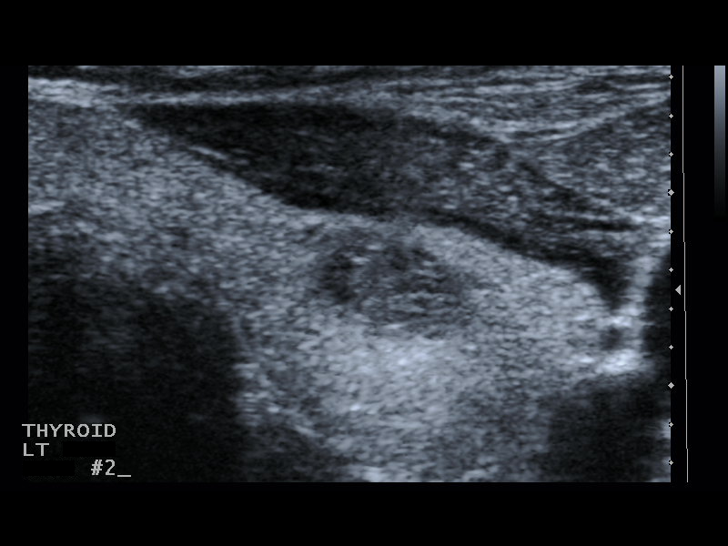
[im 8/13]
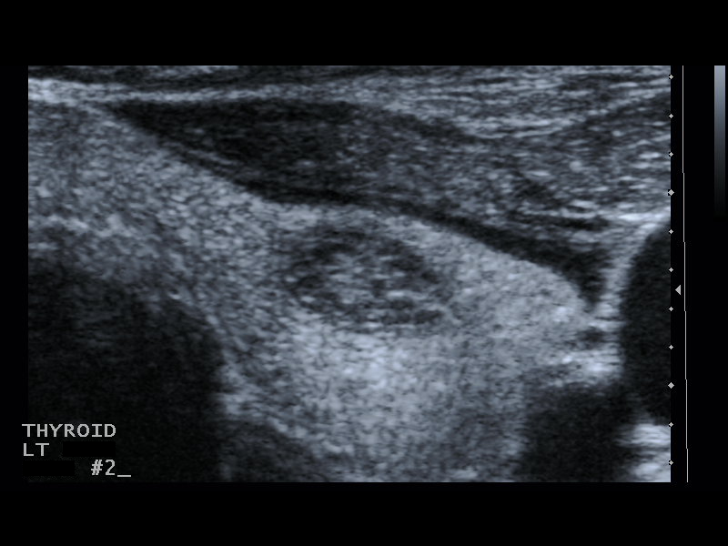
[im 9/13]
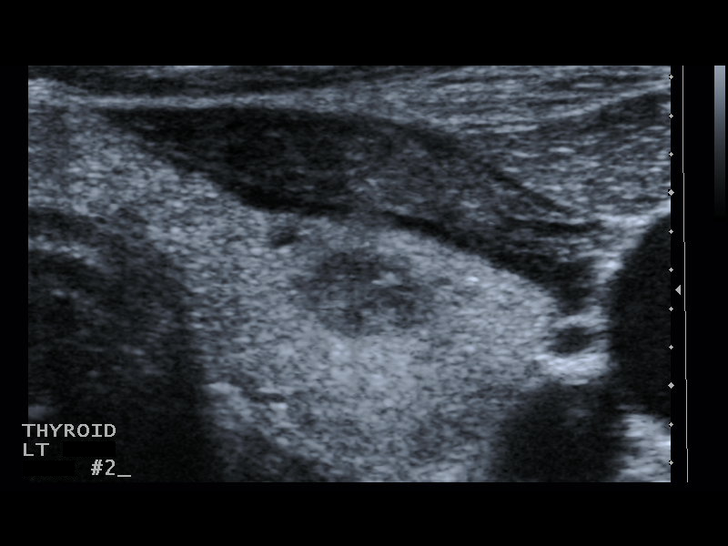
[im 10/13]
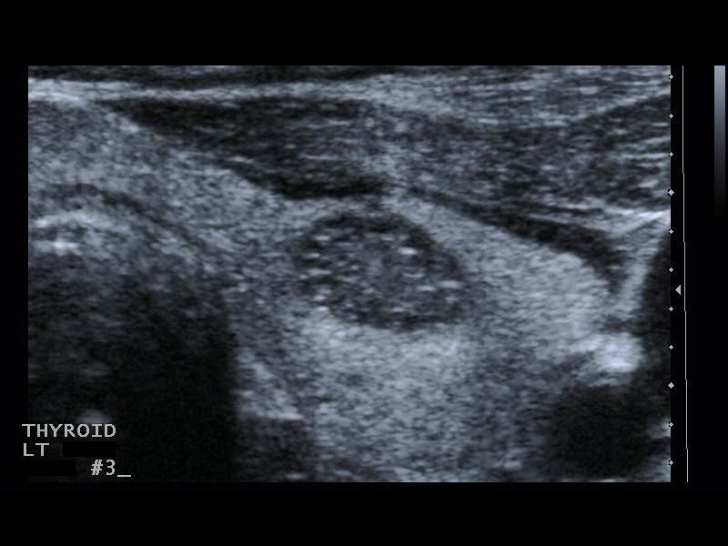
[im 11/13]
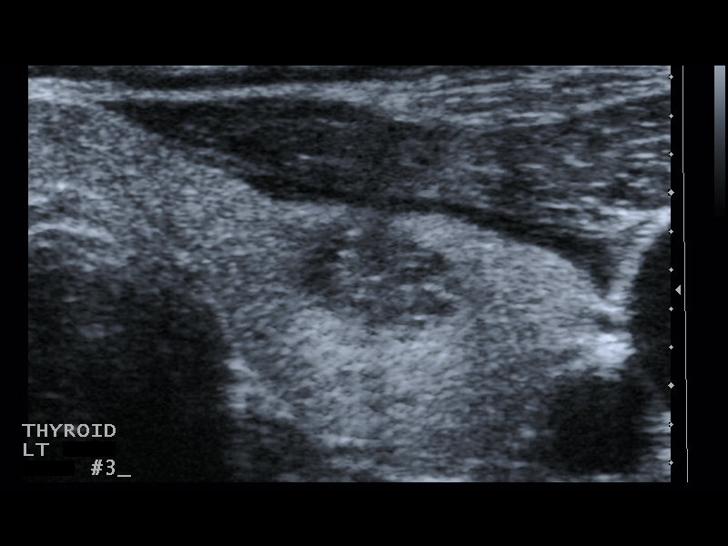
[im 12/13]
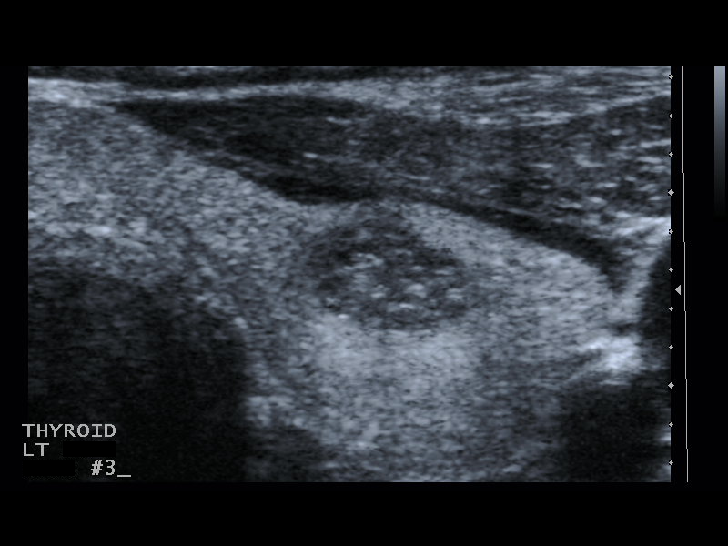
[im 13/13]
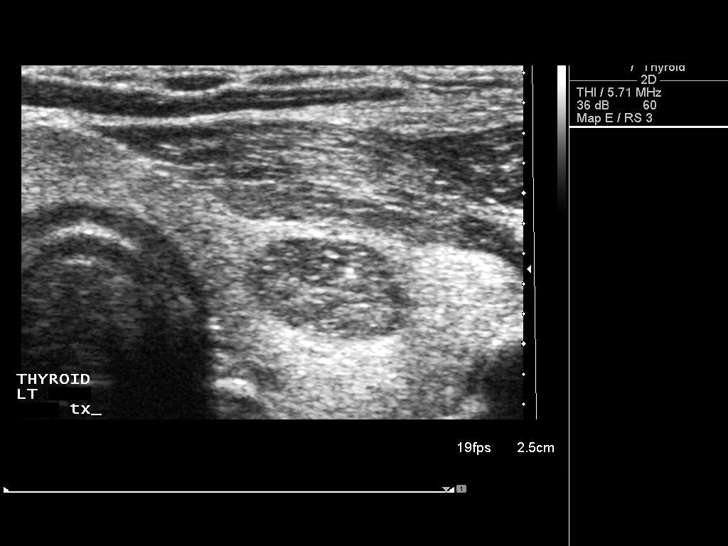

[13 of 13 positions shown; findings below may reference images not displayed]

MEDICATIONS:
1% lidocaine

COMPLICATIONS:
None immediate.

PROCEDURE:
Informed written consent was obtained from the patient after a
thorough discussion of the procedural risks, benefits and
alternatives. All questions were addressed. Maximal Sterile Barrier
Technique was utilized including mask, sterile gloves, sterile
drape, hand hygiene and skin antiseptic. A timeout was performed
prior to the initiation of the procedure.

Ultrasound was performed to localize and mark an adequate site for
the biopsy. The patient was then prepped and draped in a normal
sterile fashion. Local anesthesia was provided with 1% lidocaine.
Using direct ultrasound guidance, 5 passes were made using 25 gauge
needles into the nodule within the left lobe of the thyroid.
Ultrasound was used to confirm needle placements on all occasions.
Specimens were sent to Pathology for analysis.
IMPRESSION: Ultrasound guided needle aspirate biopsy performed of the left
thyroid nodule.

## 2018-03-22 ENCOUNTER — Other Ambulatory Visit (HOSPITAL_COMMUNITY)
Admission: RE | Admit: 2018-03-22 | Discharge: 2018-03-22 | Disposition: A | Discharge status: Home / Self Care | Payer: BC Managed Care – PPO | Source: Ambulatory Visit | Attending: Family Medicine | Admitting: Family Medicine

## 2018-03-22 ENCOUNTER — Encounter: Payer: Self-pay | Admitting: Family Medicine

## 2018-03-22 ENCOUNTER — Ambulatory Visit: Payer: BC Managed Care – PPO | Admitting: Family Medicine

## 2018-03-22 VITALS — BP 102/72 | HR 62 | Temp 98.2°F | Ht 68.0 in | Wt 181.9 lb

## 2018-03-22 DIAGNOSIS — N898 Other specified noninflammatory disorders of vagina: Secondary | ICD-10-CM | POA: Diagnosis present

## 2018-03-22 NOTE — Patient Instructions (Signed)
We have ordered labs or studies at this visit. It can take up to 1-2 weeks for results and processing. IF results require follow up or explanation, we will call you with instructions. Clinically stable results will be released to your [facility]. If you have not heard from [place] or cannot find your results in [facility] in 2 weeks please contact our office at [phone].  If you are not yet signed up for [facility], please consider signing up.  Try a vaginal moisturizer/lubricant twice weekly and with sex  I hope you are feeling better soon! Seek care promptly if your symptoms worsen, new concerns arise or you are not improving with treatment.

## 2018-03-22 NOTE — Progress Notes (Signed)
HPI:  Using dictation device. Unfortunately this device frequently misinterprets words/phrases.  Acute visit for vaginal discharge: -not really a change in amount or color, but change I nodor -x1 week -no pain, malaise, dysuria, bleeding, fever -has vag atrophy, sees gyn, estrogen products too expensive -sexually active with husband, not concerned for STI -postmenopausal  ROS: See pertinent positives and negatives per HPI.  Past Medical History:  Diagnosis Date  . Abdominal pain   . Abnormal Pap smear   . Blood in stool   . Chicken pox   . Elevated CA-125   . Endometriosis   . Fibroid   . Herpes   . Irregular menses   . Ovarian cyst, right    6cm  . Polyp of colon     Past Surgical History:  Procedure Laterality Date  . MYOMECTOMY  2007    Family History  Problem Relation Age of Onset  . Arthritis Unknown        parent  . Breast cancer Other   . Heart disease Other   . Stroke Other   . Hypertension Unknown        parent  . Hypertension Other   . Mental illness Other   . Diabetes Other   . Myasthenia gravis Mother   . Thyroid cancer Maternal Uncle     SOCIAL HX: see hpi   Current Outpatient Medications:  .  BREO ELLIPTA 100-25 MCG/INH AEPB, , Disp: , Rfl:  .  EPIPEN 2-PAK 0.3 MG/0.3ML DEVI, , Disp: , Rfl:  .  montelukast (SINGULAIR) 10 MG tablet, , Disp: , Rfl:  .  ondansetron (ZOFRAN-ODT) 4 MG disintegrating tablet, , Disp: , Rfl:  .  QNASL 80 MCG/ACT AERS, , Disp: , Rfl:  .  Spacer/Aero-Holding Chambers (AEROCHAMBER MAX W/FLOW-VU) MISC, , Disp: , Rfl:  .  valACYclovir (VALTREX) 1000 MG tablet, Take 1 tablet (1,000 mg total) by mouth 3 (three) times daily., Disp: 21 tablet, Rfl: 0  EXAM:  Vitals:   03/22/18 1439  BP: 102/72  Pulse: 62  Temp: 98.2 F (36.8 C)    Body mass index is 27.66 kg/m.  GENERAL: vitals reviewed and listed above, alert, oriented, appears well hydrated and in no acute distress  HEENT: atraumatic, conjunttiva clear,  no obvious abnormalities on inspection of external nose and ears  NECK: no obvious masses on inspection  GU: normal appearance of external genitalia - no lesions or masses appreciated, normal appearing vaginal mucosa - no abnormal discharge, normal appearance of cervix - no lesions or abnormal discharge observed, no CMT  RECTAL: deferred  MS: moves all extremities without noticeable abnormality  PSYCH: pleasant and cooperative, no obvious depression or anxiety  ASSESSMENT AND PLAN:  Discussed the following assessment and plan:  Vaginal discharge  -testing for GC/Chalm, trich, BV, yeast pending - treat accordingly -consideration vaginal moisturizer, follow up with gyn for other options that may be cheaper if needed -Patient advised to return or notify a doctor immediately if symptoms worsen or persist or new concerns arise.  Patient Instructions  We have ordered labs or studies at this visit. It can take up to 1-2 weeks for results and processing. IF results require follow up or explanation, we will call you with instructions. Clinically stable results will be released to your [facility]. If you have not heard from [place] or cannot find your results in [facility] in 2 weeks please contact our office at [phone].  If you are not yet signed up for [facility], please consider  signing up.  Try a vaginal moisturizer/lubricant twice weekly and with sex  I hope you are feeling better soon! Seek care promptly if your symptoms worsen, new concerns arise or you are not improving with treatment.       Lucretia Kern, DO

## 2018-03-23 LAB — CERVICOVAGINAL ANCILLARY ONLY
BACTERIAL VAGINITIS: POSITIVE — AB
CHLAMYDIA, DNA PROBE: NEGATIVE
Candida vaginitis: NEGATIVE
NEISSERIA GONORRHEA: NEGATIVE
TRICH (WINDOWPATH): NEGATIVE

## 2018-03-25 MED ORDER — METRONIDAZOLE 0.75 % VA GEL
1.0000 | Freq: Two times a day (BID) | VAGINAL | 0 refills | Status: AC
Start: 2018-03-25 — End: 2018-03-30

## 2018-03-25 NOTE — Addendum Note (Signed)
Addended by: Agnes Lawrence on: 03/25/2018 04:05 PM   Modules accepted: Orders

## 2018-10-05 ENCOUNTER — Ambulatory Visit: Payer: Self-pay | Admitting: *Deleted

## 2018-10-05 NOTE — Telephone Encounter (Signed)
Patient's husband works at [employer] in [place]. Received notice a co worker is positive covid. Patient has no symptoms not does husband. Reviewed quarantine precautions for total of 14 days. Sanitizing and hand washing reviewed. Husband will be set up for testing today. Call back with fever/body aches/loss of smell taste/cough.Stated she understood.   Reason for Disposition . [1] Caller concerned that exposure to COVID-19 occurred BUT [2] does not meet COVID-19 EXPOSURE criteria from [facility]  Answer Assessment - Initial Assessment Questions 1. CLOSE CONTACT: "Who is the person with the confirmed or suspected COVID-19 infection that you were exposed to?"     A coworker of her husband's. 2. PLACE of CONTACT: "Where were you when you were exposed to COVID-19?" (e.g., home, school, medical waiting room; which city?)     Her husband was at work then comes home.  3. TYPE of CONTACT: "How much contact was there?" (e.g., sitting next to, live in same house, work in same office, same building)    Saturday thru today. 4. DURATION of CONTACT: "How long were you in contact with the COVID-19 patient?" (e.g., a few seconds, passed by person, a few minutes, live with the patient)    No direct contact has she had with the positive patient.  5. DATE of CONTACT: "When did you have contact with a COVID-19 patient?" (e.g., how many days ago)    na 6. TRAVEL: "Have you traveled out of the country recently?" If so, "When and where?"no     * Also ask about out-of-state travel, since the CDC has identified some high risk cities for community spread in the [place].     * Note: Travel becomes less relevant if there is widespread community transmission where the patient lives.     none 7. COMMUNITY SPREAD: "Are there lots of cases of COVID-19 (community spread) where you live?" (See public health department website, if unsure)   * MAJOR community spread: high number of cases; numbers of cases are increasing; many people  hospitalized.   * MINOR community spread: low number of cases; not increasing; few or no people hospitalized      8. SYMPTOMS: "Do you have any symptoms?" (e.g., fever, cough, breathing difficulty)    none 9. PREGNANCY OR POSTPARTUM: "Is there any chance you are pregnant?" "When was your last menstrual period?" "Did you deliver in the last 2 weeks?"    no 10. HIGH RISK: "Do you have any heart or lung problems? Do you have a weak immune system?" (e.g., CHF, COPD, asthma, HIV positive, chemotherapy, renal failure, diabetes mellitus, sickle cell anemia)       no  Protocols used: CORONAVIRUS (COVID-19) EXPOSURE-A-AH

## 2019-02-02 ENCOUNTER — Other Ambulatory Visit: Payer: Self-pay

## 2019-02-02 DIAGNOSIS — Z20822 Contact with and (suspected) exposure to covid-19: Secondary | ICD-10-CM

## 2019-02-03 LAB — NOVEL CORONAVIRUS, NAA: SARS-CoV-2, NAA: NOT DETECTED

## 2019-04-04 ENCOUNTER — Encounter: Payer: Self-pay | Admitting: Family Medicine

## 2019-04-04 ENCOUNTER — Other Ambulatory Visit: Payer: Self-pay

## 2019-04-04 ENCOUNTER — Telehealth (INDEPENDENT_AMBULATORY_CARE_PROVIDER_SITE_OTHER): Payer: BC Managed Care – PPO | Admitting: Family Medicine

## 2019-04-04 DIAGNOSIS — L659 Nonscarring hair loss, unspecified: Secondary | ICD-10-CM

## 2019-04-04 NOTE — Progress Notes (Signed)
Virtual Visit via Video Note  I connected with Eileen Martinez  on 04/04/19 at 10:40 AM EST by a video enabled telemedicine application and verified that I am speaking with the correct person using two identifiers.  Location patient: [place], office Location provider:work or home office Persons participating in the virtual visit: patient, provider  I discussed the limitations of evaluation and management by telemedicine and the availability of in person appointments. The patient expressed understanding and agreed to proceed.   HPI:  Acute visit for hair loss: -the last few months has noticed some general shedding more then usual -went to Ob/gyn for hot flashes and was put on progesterone and estradiol and it seem to worsen then -no bald spots, itching, scaling -she saw a hair stylist specialist for this and the hair is growing under the skin, was told this could be from hormones -she started eating healthier and doing kale and spinach smoothies and cut her hair short and it seems to be a little better -she wants a referral to a dermatologist about this -she has a physical set up in January and prefers to check labs then    ROS: See pertinent positives and negatives per HPI.  Past Medical History:  Diagnosis Date  . Abdominal pain   . Abnormal Pap smear   . Blood in stool   . Chicken pox   . Elevated CA-125   . Endometriosis   . Fibroid   . Herpes   . Irregular menses   . Ovarian cyst, right    6cm  . Polyp of colon     Past Surgical History:  Procedure Laterality Date  . MYOMECTOMY  2007    Family History  Problem Relation Age of Onset  . Arthritis Unknown        parent  . Breast cancer Other   . Heart disease Other   . Stroke Other   . Hypertension Unknown        parent  . Hypertension Other   . Mental illness Other   . Diabetes Other   . Myasthenia gravis Mother   . Thyroid cancer Maternal Uncle     SOCIAL HX: see hpi   Current Outpatient Medications:  .  BREO  ELLIPTA 100-25 MCG/INH AEPB, , Disp: , Rfl:  .  EPIPEN 2-PAK 0.3 MG/0.3ML DEVI, , Disp: , Rfl:  .  estradiol (VIVELLE-DOT) 0.025 MG/24HR, Place 1 patch onto the skin 2 (two) times a week., Disp: , Rfl:  .  montelukast (SINGULAIR) 10 MG tablet, , Disp: , Rfl:  .  progesterone (PROMETRIUM) 100 MG capsule, Take by mouth at bedtime. , Disp: , Rfl:  .  Spacer/Aero-Holding Chambers (AEROCHAMBER MAX W/FLOW-VU) MISC, , Disp: , Rfl:   EXAM:  VITALS per patient if applicable:  GENERAL: alert, oriented, appears well and in no acute distress  HEENT: atraumatic, conjunttiva clear, no obvious abnormalities on inspection of external nose and ears, short hair cut, no obvious balding or alopecia on gross exam  NECK: normal movements of the head and neck  LUNGS: on inspection no signs of respiratory distress, breathing rate appears normal, no obvious gross SOB, gasping or wheezing  CV: no obvious cyanosis  MS: moves all visible extremities without noticeable abnormality  PSYCH/NEURO: pleasant and cooperative, no obvious depression or anxiety, speech and thought processing grossly intact  ASSESSMENT AND PLAN:  Discussed the following assessment and plan:  Hair loss - Plan: Ambulatory referral to Dermatology  -we discussed possible serious and likely etiologies,  options for evaluation and workup, limitations of telemedicine visit vs in person visit, treatment, treatment risks and precautions. Pt prefers to treat via telemedicine empirically rather then risking or undertaking an in person visit at this moment. Sent referral to dermatology for evaluation. Offered labs I interim to check thyroid and basic labs - she prefers to wait as will be calling gyn and also has CPE to establish with Dr. Ethlyn Gallery in January and prefers to do in one visit. Advised daily hair count test in interim. Patient agrees to seek prompt in person care if worsening, new symptoms arise, or if is not improving with treatment.   I  discussed the assessment and treatment plan with the patient. The patient was provided an opportunity to ask questions and all were answered. The patient agreed with the plan and demonstrated an understanding of the instructions.   The patient was advised to call back or seek an in-person evaluation if the symptoms worsen or if the condition fails to improve as anticipated.   Eileen Kern, DO

## 2019-04-18 LAB — CBC: RBC: 4.55 (ref 3.87–5.11)

## 2019-04-18 LAB — BASIC METABOLIC PANEL
BUN: 18 (ref 4–21)
CO2: 29 — AB (ref 13–22)
Chloride: 104 (ref 99–108)
Creatinine: 0.9 (ref <=1.1)
Glucose: 80
Potassium: 4.7 (ref 3.4–5.3)
Sodium: 139 (ref 137–147)

## 2019-04-18 LAB — IRON,TIBC AND FERRITIN PANEL
Ferritin: 73
Iron: 99

## 2019-04-18 LAB — LIPID PANEL
Cholesterol: 217 — AB (ref 0–200)
HDL: 61 (ref 35–70)
LDL Cholesterol: 145
Triglycerides: 56 (ref 40–160)

## 2019-04-18 LAB — CBC AND DIFFERENTIAL
HCT: 41 (ref 36–46)
Hemoglobin: 13 (ref 12.0–16.0)
Platelets: 185 (ref 150–399)
WBC: 4

## 2019-04-18 LAB — TSH: TSH: 1.08 (ref <=5.90)

## 2019-04-18 LAB — COMPREHENSIVE METABOLIC PANEL
Albumin: 4.2 (ref 3.5–5.0)
Calcium: 9.3 (ref 8.7–10.7)

## 2019-04-18 LAB — HEPATIC FUNCTION PANEL
ALT: 43 — AB (ref 7–35)
AST: 34 (ref 13–35)
Alkaline Phosphatase: 84 (ref 25–125)
Bilirubin, Total: 1

## 2019-04-18 LAB — HEMOGLOBIN A1C: Hemoglobin A1C: 5.2

## 2019-05-17 ENCOUNTER — Encounter: Payer: Self-pay | Admitting: Family Medicine

## 2019-05-17 ENCOUNTER — Ambulatory Visit (INDEPENDENT_AMBULATORY_CARE_PROVIDER_SITE_OTHER): Payer: BC Managed Care – PPO | Admitting: Family Medicine

## 2019-05-17 ENCOUNTER — Other Ambulatory Visit: Payer: Self-pay

## 2019-05-17 VITALS — BP 92/62 | HR 61 | Temp 97.1°F | Ht 67.5 in | Wt 185.4 lb

## 2019-05-17 DIAGNOSIS — Z0000 Encounter for general adult medical examination without abnormal findings: Secondary | ICD-10-CM | POA: Diagnosis not present

## 2019-05-17 DIAGNOSIS — N898 Other specified noninflammatory disorders of vagina: Secondary | ICD-10-CM | POA: Diagnosis not present

## 2019-05-17 DIAGNOSIS — K5900 Constipation, unspecified: Secondary | ICD-10-CM

## 2019-05-17 DIAGNOSIS — L659 Nonscarring hair loss, unspecified: Secondary | ICD-10-CM | POA: Diagnosis not present

## 2019-05-17 NOTE — Patient Instructions (Addendum)
Eileen Martinez  Dermatologist in [place], [place]  [address], [place], [place] [address]   [phone]  Consider miralax a few times weekly for bowels.  Consider replens vaginal treatment for dryness   It was nice meeting you today! Let me know if any difficulty scheduling with dermatology. I am also happy to order additional bloodwork for GI concerns/skin concerns if desired.

## 2019-05-17 NOTE — Progress Notes (Signed)
Eileen Martinez DOB: 04-23-1971 Encounter date: 05/17/2019  This is a 49 y.o. female who presents for complete physical   History of present illness/Additional concerns: Saw Dr. Maudie Mercury in November and discussed some hair loss.  Wanted to get blood work done at today's appointment.  She was referred to dermatology.  Feels like she has a lot of water retention. States that this had always been issue with endometriosis. Feels like she is putting on weight. Feels abdominal bloating; feels that fingers stay swollen. Only feels better after having good bowel movement.   Bowels are issue for her. Tries to do grains, fiber, but still has a hard time. Follows with GI just for screening. Due in Feb for repeat colonoscopy. Usually has bowel movement every other day. Hemorrhoid did resolve with treatment from GI.   Follows with Dr. Julien Girt for gynecology care. Hasn't had a cycle in over a year.   Walks and jogs regularly. Trying to eat healthy.   Hydradenitis went away with dairy elimination.  Past Medical History:  Diagnosis Date  . Abdominal pain   . Abnormal Pap smear   . Blood in stool   . Chicken pox   . Elevated CA-125   . Endometriosis   . Fibroid   . Herpes   . Irregular menses   . Ovarian cyst, right    6cm  . Polyp of colon    Past Surgical History:  Procedure Laterality Date  . MYOMECTOMY  2007   No Known Allergies No outpatient medications have been marked as taking for the 05/17/19 encounter (Office Visit) with Caren Macadam, MD.   Social History   Tobacco Use  . Smoking status: Never Smoker  . Smokeless tobacco: Never Used  Substance Use Topics  . Alcohol use: No   Family History  Problem Relation Age of Onset  . Breast cancer Other 30       maternal aunt  . Heart disease Other   . Stroke Other        maternal aunt  . Mental illness Other   . Diabetes Other        maternal aunts x 2  . Myasthenia gravis Mother   . Hypertension Mother   . Osteoarthritis  Mother   . Thyroid cancer Maternal Uncle   . Thyroid cancer Paternal Uncle      Review of Systems  Constitutional: Negative for activity change, appetite change, chills, fatigue, fever and unexpected weight change.  HENT: Negative for congestion, ear pain, hearing loss, sinus pressure, sinus pain, sore throat and trouble swallowing.   Eyes: Negative for pain and visual disturbance.  Respiratory: Negative for cough, chest tightness, shortness of breath and wheezing.   Cardiovascular: Negative for chest pain, palpitations and leg swelling.  Gastrointestinal: Positive for constipation (usually bowel every other day; feels better after BM). Negative for abdominal pain, blood in stool, diarrhea, nausea and vomiting.  Genitourinary: Negative for difficulty urinating and menstrual problem.       Vaginal dryness; not improved with estrogen patch or topical  Musculoskeletal: Negative for arthralgias and back pain.  Skin: Negative for rash.  Neurological: Negative for dizziness, weakness, numbness and headaches.  Hematological: Negative for adenopathy. Does not bruise/bleed easily.  Psychiatric/Behavioral: Negative for sleep disturbance and suicidal ideas. The patient is not nervous/anxious.     CBC: No results found for: WBC, HGB, HCT, MCH, MCHC, RDW, PLT, MPV CMP: Lab Results  Component Value Date   NA 137 05/20/2016   K 4.4  05/20/2016   CL 102 05/20/2016   CO2 30 05/20/2016   GLUCOSE 100 (H) 05/20/2016   BUN 16 05/20/2016   CREATININE 0.75 05/20/2016   CALCIUM 9.1 05/20/2016   LIPID: Lab Results  Component Value Date   CHOL 212 (H) 11/25/2015   TRIG 50.0 11/25/2015   HDL 65.60 11/25/2015   LDLCALC 137 (H) 11/25/2015    Objective:  BP 92/62 (BP Location: Left Arm, Patient Position: Sitting, Cuff Size: Normal)   Pulse 61   Temp (!) 97.1 F (36.2 C) (Temporal)   Ht 5' 7.5" (1.715 m)   Wt 185 lb 6.4 oz (84.1 kg)   SpO2 98%   BMI 28.61 kg/m   Weight: 185 lb 6.4 oz (84.1 kg)    BP Readings from Last 3 Encounters:  05/17/19 92/62  03/22/18 102/72  09/28/16 120/70   Wt Readings from Last 3 Encounters:  05/17/19 185 lb 6.4 oz (84.1 kg)  03/22/18 181 lb 14.4 oz (82.5 kg)  09/28/16 176 lb 8 oz (80.1 kg)    Physical Exam Constitutional:      General: She is not in acute distress.    Appearance: She is well-developed.  HENT:     Head: Normocephalic and atraumatic.     Right Ear: External ear normal.     Left Ear: External ear normal.     Mouth/Throat:     Pharynx: No oropharyngeal exudate.  Eyes:     Conjunctiva/sclera: Conjunctivae normal.     Pupils: Pupils are equal, round, and reactive to light.  Neck:     Thyroid: No thyromegaly.  Cardiovascular:     Rate and Rhythm: Normal rate and regular rhythm.     Heart sounds: Normal heart sounds. No murmur. No friction rub. No gallop.   Pulmonary:     Effort: Pulmonary effort is normal.     Breath sounds: Normal breath sounds.  Abdominal:     General: Bowel sounds are normal. There is no distension.     Palpations: Abdomen is soft. There is no mass.     Tenderness: There is no abdominal tenderness. There is no guarding.     Hernia: No hernia is present.  Musculoskeletal:        General: No tenderness or deformity. Normal range of motion.     Cervical back: Normal range of motion and neck supple.  Lymphadenopathy:     Cervical: No cervical adenopathy.  Skin:    General: Skin is warm and dry.     Findings: No rash.     Comments: There is 1.5cm brown macule/slightly dry inside right wrist  Neurological:     Mental Status: She is alert and oriented to person, place, and time.     Deep Tendon Reflexes: Reflexes normal.     Reflex Scores:      Tricep reflexes are 2+ on the right side and 2+ on the left side.      Bicep reflexes are 2+ on the right side and 2+ on the left side.      Brachioradialis reflexes are 2+ on the right side and 2+ on the left side.      Patellar reflexes are 2+ on the right  side and 2+ on the left side. Psychiatric:        Speech: Speech normal.        Behavior: Behavior normal.        Thought Content: Thought content normal.     Assessment/Plan: There are no preventive  care reminders to display for this patient. Health Maintenance reviewed.  1. Preventative health care Keep up with healthy lifestyle.   2. Hair loss Gave number to call dermatology. Reviewed labs from [facility] - normal CBC, ferritin, CMP, TSH, T3/T4 (will be scanned in). Vitamin D in low normal and supplementation recommended from [facility]. I do not feel that further lab evaluation needed from my standpoint now.   3. Vaginal dryness: encouraged her to talk with Dr. Julien Girt. Didn't get relief with topical estrogen in addition to patches. Patches not helping. Can try replens in meanwhile.   4. Dermatitis - single patch on wrist, improving with steroid cream. Lab tech mentioned relation to bowel issue (like celiac). Advised to have derm look at this; but happy to order this bloodwork in future.  5. Constipation: suggested miralax a few days/week. She is also interested in trying elimination diet, which may help with bowels as well.  Return in about 1 year (around 05/16/2020) for physical exam.  Micheline Rough, MD

## 2019-05-19 ENCOUNTER — Encounter: Payer: Self-pay | Admitting: Family Medicine

## 2019-07-13 ENCOUNTER — Other Ambulatory Visit: Payer: Self-pay

## 2019-07-14 ENCOUNTER — Encounter: Payer: Self-pay | Admitting: Family Medicine

## 2019-07-14 ENCOUNTER — Ambulatory Visit: Payer: BC Managed Care – PPO | Admitting: Family Medicine

## 2019-07-14 VITALS — BP 110/62 | HR 66 | Temp 97.6°F | Wt 184.2 lb

## 2019-07-14 DIAGNOSIS — B9689 Other specified bacterial agents as the cause of diseases classified elsewhere: Secondary | ICD-10-CM

## 2019-07-14 DIAGNOSIS — N760 Acute vaginitis: Secondary | ICD-10-CM

## 2019-07-14 MED ORDER — METRONIDAZOLE 0.75 % VA GEL: 1.0000 | Freq: Two times a day (BID) | VAGINAL | 0 refills | Status: DC

## 2019-07-14 NOTE — Progress Notes (Signed)
   Subjective:    Patient ID: Eileen Martinez, female    DOB: 1971/03/08, 49 y.o.   MRN: [id number]  HPI Here for 2 days of a light tan vaginal DC with a foul odor. No pain or itching. She has had vaginal bacterial infections in the past and she is certain this is another one.    Review of Systems  Constitutional: Negative.   Respiratory: Negative.   Cardiovascular: Negative.   Genitourinary: Positive for vaginal discharge. Negative for pelvic pain.       Objective:   Physical Exam Constitutional:      Appearance: Normal appearance.  Cardiovascular:     Rate and Rhythm: Normal rate and regular rhythm.     Pulses: Normal pulses.     Heart sounds: Normal heart sounds.  Pulmonary:     Effort: Pulmonary effort is normal.     Breath sounds: Normal breath sounds.  Abdominal:     General: Abdomen is flat. Bowel sounds are normal. There is no distension.     Palpations: Abdomen is soft. There is no mass.     Tenderness: There is no abdominal tenderness. There is no guarding or rebound.     Hernia: No hernia is present.  Neurological:     Mental Status: She is alert.           Assessment & Plan:  Bacterial vaginitis, treat with Metronidazole vaginal gel. Alysia Penna, MD

## 2020-03-05 ENCOUNTER — Telehealth (INDEPENDENT_AMBULATORY_CARE_PROVIDER_SITE_OTHER): Payer: BC Managed Care – PPO | Admitting: Family Medicine

## 2020-03-05 ENCOUNTER — Encounter: Payer: Self-pay | Admitting: Family Medicine

## 2020-03-05 VITALS — Temp 97.1°F

## 2020-03-05 DIAGNOSIS — R109 Unspecified abdominal pain: Secondary | ICD-10-CM | POA: Diagnosis not present

## 2020-03-05 DIAGNOSIS — K5900 Constipation, unspecified: Secondary | ICD-10-CM

## 2020-03-05 DIAGNOSIS — R630 Anorexia: Secondary | ICD-10-CM | POA: Diagnosis not present

## 2020-03-05 NOTE — Patient Instructions (Signed)
MiraLAX 1 capful in water 1-2 times daily for 5 to 7 days, or until soft bowel movements daily for 3 to 4 days.  Increase fiber in your diet.  Drink plenty of water.  See your gynecologist as planned.  Follow-up with your gastroenterologist as well if needed.  I hope you are feeling better soon!  Seek in person care promptly if your symptoms worsen, new concerns arise or you are not improving with treatment.  It was nice to meet you today. I help [facility] out with telemedicine visits on Tuesdays and Thursdays and am available for visits on those days. If you have any concerns or questions following this visit please schedule a follow up visit with your Primary Care doctor or seek care at a local urgent care clinic to avoid delays in care.

## 2020-03-05 NOTE — Progress Notes (Signed)
Virtual Visit via Video Note  I connected with Eileen Martinez  on 03/05/20 at  3:20 PM EDT by a video enabled telemedicine application and verified that I am speaking with the correct person using two identifiers.  Location patient: home, [place] Location provider:work or home office Persons participating in the virtual visit: patient, provider  I discussed the limitations of evaluation and management by telemedicine and the availability of in person appointments. The patient expressed understanding and agreed to proceed.   HPI:  Acute telemedicine visit for : -Onset: 10/22 -Symptoms include: upset stomach, decreased appetite, had some epigastric abd pain, constipation -felt better after BM yesterday -also is perimenopausal and started her menstrual cycle 2 days ago; seeing gyn tomorrow as she is on hormones with her gyn -she has been getting some headaches which she thinks if from not eating - even though she feels better  -Denies: fevers, vomiting, diarrhea, congestion, cough, blood in the bowels, melena -Has tried:green smoothie -Pertinent medication allergies:nkda -COVID-19 vaccine status: fully vaccinated with Moderna   ROS: See pertinent positives and negatives per HPI.  Past Medical History:  Diagnosis Date  . Abdominal pain   . Abnormal Pap smear   . Blood in stool   . Chicken pox   . Elevated CA-125   . Endometriosis   . Fibroid   . Herpes   . Irregular menses   . Ovarian cyst, right    6cm  . Polyp of colon     Past Surgical History:  Procedure Laterality Date  . MYOMECTOMY  2007     Current Outpatient Medications:  .  EPIPEN 2-PAK 0.3 MG/0.3ML DEVI, , Disp: , Rfl:  .  estradiol (VIVELLE-DOT) 0.025 MG/24HR, Place 1 patch onto the skin 2 (two) times a week., Disp: , Rfl:  .  metroNIDAZOLE (METROGEL) 0.75 % vaginal gel, Place 1 Applicatorful vaginally 2 (two) times daily., Disp: 70 g, Rfl: 0 .  progesterone (PROMETRIUM) 100 MG capsule, Take by mouth at bedtime. , Disp:  , Rfl:   EXAM:  VITALS per patient if applicable:  GENERAL: alert, oriented, appears well and in no acute distress  HEENT: atraumatic, conjunttiva clear, no obvious abnormalities on inspection of external nose and ears  NECK: normal movements of the head and neck  LUNGS: on inspection no signs of respiratory distress, breathing rate appears normal, no obvious gross SOB, gasping or wheezing  CV: no obvious cyanosis  MS: moves all visible extremities without noticeable abnormality  PSYCH/NEURO: pleasant and cooperative, no obvious depression or anxiety, speech and thought processing grossly intact  ASSESSMENT AND PLAN:  Discussed the following assessment and plan:  Constipation, unspecified constipation type  Decreased appetite  Abdominal discomfort  -we discussed possible serious and likely etiologies, options for evaluation and workup, limitations of telemedicine visit vs in person visit, treatment, treatment risks and precautions. Pt prefers to treat via telemedicine empirically rather than in person at this moment. It sounds like she has some hormonal gyn issues going on (possibly perimenopausal) along with some constipation this past week. Reports abd symptoms better after BM yesterday, but she still feels is "stopped up." Discussed options for treating constipation and she opted for MiraLAX 1 capful in water daily for 5 to 7 days or until soft bowel movements for several days.  Also discussed increasing fiber.  She has an appointment with her gynecologist tomorrow for evaluation.  She also plans to follow-up with her gastroenterologist.  Advised to seek prompt in person care if worsening, new symptoms  arise, or if is not improving with treatment. Discussed options for inperson care if PCP office not available. Did let this patient know that I only do telemedicine on Tuesdays and Thursdays for [facility]. Advised to schedule follow up visit with PCP or [facility] if any further questions or  concerns to avoid delays in care.   I discussed the assessment and treatment plan with the patient. The patient was provided an opportunity to ask questions and all were answered. The patient agreed with the plan and demonstrated an understanding of the instructions.     Lucretia Kern, DO

## 2020-03-08 ENCOUNTER — Ambulatory Visit: Payer: BC Managed Care – PPO | Admitting: Family Medicine

## 2020-03-13 ENCOUNTER — Ambulatory Visit: Payer: BC Managed Care – PPO | Attending: Family

## 2020-03-13 DIAGNOSIS — Z23 Encounter for immunization: Secondary | ICD-10-CM

## 2020-04-12 LAB — HM COLONOSCOPY

## 2020-05-20 ENCOUNTER — Encounter: Payer: BC Managed Care – PPO | Admitting: Family Medicine

## 2020-05-25 NOTE — Progress Notes (Signed)
   Covid-19 Vaccination Clinic  Name:  Eileen Martinez    MRN: [id number] DOB: Sep 21, 1970  05/25/2020  Eileen Martinez was observed post Covid-19 immunization for 15 minutes without incident. She was provided with Vaccine Information Sheet and instruction to access the V-Safe system.   Eileen Martinez was instructed to call 911 with any severe reactions post vaccine: Marland Kitchen Difficulty breathing  . Swelling of face and throat  . A fast heartbeat  . A bad rash all over body  . Dizziness and weakness   Immunizations Administered    Name Date Dose VIS Date Route   Moderna Covid-19 Booster Vaccine 03/13/2020 11:55 AM 0.25 mL 02/28/2020 Intramuscular   Manufacturer: Moderna   Lot: [id number]   Mark: [id number]

## 2020-06-05 ENCOUNTER — Encounter: Payer: Self-pay | Admitting: Family Medicine

## 2020-07-26 ENCOUNTER — Other Ambulatory Visit: Payer: Self-pay

## 2020-07-26 ENCOUNTER — Ambulatory Visit (INDEPENDENT_AMBULATORY_CARE_PROVIDER_SITE_OTHER): Payer: BC Managed Care – PPO | Admitting: Family Medicine

## 2020-07-26 ENCOUNTER — Encounter: Payer: Self-pay | Admitting: Family Medicine

## 2020-07-26 VITALS — BP 100/62 | HR 79 | Temp 98.0°F | Ht 67.25 in | Wt 143.8 lb

## 2020-07-26 DIAGNOSIS — R748 Abnormal levels of other serum enzymes: Secondary | ICD-10-CM | POA: Diagnosis not present

## 2020-07-26 DIAGNOSIS — Z0000 Encounter for general adult medical examination without abnormal findings: Secondary | ICD-10-CM

## 2020-07-26 DIAGNOSIS — E785 Hyperlipidemia, unspecified: Secondary | ICD-10-CM | POA: Diagnosis not present

## 2020-07-26 DIAGNOSIS — E538 Deficiency of other specified B group vitamins: Secondary | ICD-10-CM | POA: Diagnosis not present

## 2020-07-26 DIAGNOSIS — E559 Vitamin D deficiency, unspecified: Secondary | ICD-10-CM | POA: Diagnosis not present

## 2020-07-26 DIAGNOSIS — R634 Abnormal weight loss: Secondary | ICD-10-CM | POA: Diagnosis not present

## 2020-07-26 DIAGNOSIS — R1030 Lower abdominal pain, unspecified: Secondary | ICD-10-CM | POA: Diagnosis not present

## 2020-07-26 LAB — VITAMIN D 25 HYDROXY (VIT D DEFICIENCY, FRACTURES): VITD: 35.25 ng/mL (ref 30.00–100.00)

## 2020-07-26 LAB — COMPREHENSIVE METABOLIC PANEL
ALT: 10 U/L (ref 0–35)
AST: 14 U/L (ref 0–37)
Albumin: 4.1 g/dL (ref 3.5–5.2)
Alkaline Phosphatase: 87 U/L (ref 39–117)
BUN: 16 mg/dL (ref 6–23)
CO2: 30 mEq/L (ref 19–32)
Calcium: 9.8 mg/dL (ref 8.4–10.5)
Chloride: 102 mEq/L (ref 96–112)
Creatinine, Ser: 0.78 mg/dL (ref 0.40–1.20)
GFR: 89.14 mL/min (ref >60.00)
Glucose, Bld: 84 mg/dL (ref 70–99)
Potassium: 4.7 mEq/L (ref 3.5–5.1)
Sodium: 138 mEq/L (ref 135–145)
Total Bilirubin: 0.8 mg/dL (ref 0.2–1.2)
Total Protein: 8.3 g/dL (ref 6.0–8.3)

## 2020-07-26 LAB — LIPID PANEL
Cholesterol: 205 mg/dL — ABNORMAL HIGH (ref 0–200)
HDL: 51.2 mg/dL (ref >39.00)
LDL Cholesterol: 139 mg/dL — ABNORMAL HIGH (ref 0–99)
NonHDL: 153.61
Total CHOL/HDL Ratio: 4
Triglycerides: 73 mg/dL (ref 0.0–149.0)
VLDL: 14.6 mg/dL (ref 0.0–40.0)

## 2020-07-26 LAB — TSH: TSH: 1.19 u[IU]/mL (ref 0.35–4.50)

## 2020-07-26 LAB — VITAMIN B12: Vitamin B-12: 670 pg/mL (ref 211–911)

## 2020-07-26 LAB — CBC WITH DIFFERENTIAL/PLATELET
Basophils Absolute: 0 10*3/uL (ref 0.0–0.1)
Basophils Relative: 0.7 % (ref 0.0–3.0)
Eosinophils Absolute: 0.1 10*3/uL (ref 0.0–0.7)
Eosinophils Relative: 0.9 % (ref 0.0–5.0)
HCT: 35.4 % — ABNORMAL LOW (ref 36.0–46.0)
Hemoglobin: 11.5 g/dL — ABNORMAL LOW (ref 12.0–15.0)
Lymphocytes Relative: 28.2 % (ref 12.0–46.0)
Lymphs Abs: 2 10*3/uL (ref 0.7–4.0)
MCHC: 32.6 g/dL (ref 30.0–36.0)
MCV: 82.4 fl (ref 78.0–100.0)
Monocytes Absolute: 0.7 10*3/uL (ref 0.1–1.0)
Monocytes Relative: 10 % (ref 3.0–12.0)
Neutro Abs: 4.3 10*3/uL (ref 1.4–7.7)
Neutrophils Relative %: 60.2 % (ref 43.0–77.0)
Platelets: 259 10*3/uL (ref 150.0–400.0)
RBC: 4.29 Mil/uL (ref 3.87–5.11)
RDW: 14.2 % (ref 11.5–15.5)
WBC: 7.1 10*3/uL (ref 4.0–10.5)

## 2020-07-26 LAB — T3, FREE: T3, Free: 3.4 pg/mL (ref 2.3–4.2)

## 2020-07-26 LAB — T4, FREE: Free T4: 0.99 ng/dL (ref 0.60–1.60)

## 2020-07-26 LAB — LIPASE: Lipase: 27 U/L (ref 11.0–59.0)

## 2020-07-26 LAB — AMYLASE: Amylase: 47 U/L (ref 27–131)

## 2020-07-26 NOTE — Progress Notes (Signed)
Eileen Martinez DOB: 03-05-1971 Encounter date: 07/26/2020  This is a 50 y.o. female who presents for complete physical   History of present illness/Additional concerns: Had endoscopy/colonoscopy back in December. Dx with enteropathy. Has no appetite now. Always seems full. Eats a couple of bites, and then feels satisfied. Saw Dr. Paulita Fujita. She is doing greens, salads, getting nutrients. Just never felt this way before. Sometimes feels like she ate when she didn't. Doesn't feel bloated, just feels full. A little tender mid abdomen. No blockage, no ulcers. No nausea, no vomiting. Bowel movements are regular - doing miralax. Thinks that weight is still decreasing. Sometimes forcing self to eat. If she forces self to eat feels like she ate more than she did. Exercise does seem to help with that fullness sensation. Was eating back in November and had epigastric pain that was so intense - tender to touch even and then was set up for endoscopy/colonoscopy - no other imaging. Did some labwork for evaluation as well at that time. Not desiring meat like she used to. Since cutting out gluten things have been more tolerable. Everything changed after episode in November.   Energy level is good. Exercising regularly - walking, some aerobics, weight room. Sleeps well. Not having hot flashes like she used to. Hair, nails growing. Feels healthier really overall.   Sometimes LLQ gets tight - this happens when she is constipated and improves/resolves with BM (reason she takes miralax).   No issues urinating. No UTI in over a year. That has improved.   Follows with Dr. Julien Girt for gynecology care. [facility]: [facility]. Stays on top of this yearly.  Generally feels mood is pretty good - feels like sometimes with menopause - then gets a little change in mood.   Past Medical History:  Diagnosis Date  . Abdominal pain   . Abnormal Pap smear   . Blood in stool   . Chicken pox   . Elevated CA-125    . Endometriosis   . Fibroid   . Herpes   . Irregular menses   . Ovarian cyst, right    6cm  . Polyp of colon    Past Surgical History:  Procedure Laterality Date  . MYOMECTOMY  2007   No Known Allergies Current Meds  Medication Sig  . EPIPEN 2-PAK 0.3 MG/0.3ML DEVI   . [DISCONTINUED] progesterone (PROMETRIUM) 100 MG capsule Take by mouth at bedtime.    Social History   Tobacco Use  . Smoking status: Never Smoker  . Smokeless tobacco: Never Used  Substance Use Topics  . Alcohol use: No   Family History  Problem Relation Age of Onset  . Breast cancer Other 30       maternal aunt  . Heart disease Other   . Stroke Other        maternal aunt  . Mental illness Other   . Diabetes Other        maternal aunts x 2  . Myasthenia gravis Mother   . Hypertension Mother   . Osteoarthritis Mother   . Thyroid cancer Maternal Uncle   . Thyroid cancer Paternal Uncle      Review of Systems  Constitutional: Negative for activity change, appetite change, chills, fatigue, fever and unexpected weight change.  HENT: Negative for congestion, ear pain, hearing loss, sinus pressure, sinus pain, sore throat and trouble swallowing.   Eyes: Negative for pain and visual disturbance.  Respiratory: Negative for cough, chest tightness, shortness of breath and  wheezing.   Cardiovascular: Negative for chest pain, palpitations and leg swelling.  Gastrointestinal: Positive for abdominal pain (lower abd; occurs every time she needs to have bowel mvmt/any constipation). Negative for blood in stool, constipation, diarrhea, nausea and vomiting.  Genitourinary: Negative for difficulty urinating and menstrual problem.  Musculoskeletal: Negative for arthralgias and back pain.  Skin: Negative for rash.  Neurological: Negative for dizziness, weakness, numbness and headaches.  Hematological: Negative for adenopathy. Does not bruise/bleed easily.  Psychiatric/Behavioral: Negative for sleep disturbance and  suicidal ideas. The patient is not nervous/anxious.     CBC:  Lab Results  Component Value Date   WBC 7.1 07/26/2020   HGB 11.5 (L) 07/26/2020   HCT 35.4 (L) 07/26/2020   MCHC 32.6 07/26/2020   RDW 14.2 07/26/2020   PLT 259.0 07/26/2020   CMP: Lab Results  Component Value Date   NA 138 07/26/2020   NA 139 04/18/2019   K 4.7 07/26/2020   CL 102 07/26/2020   CO2 30 07/26/2020   GLUCOSE 84 07/26/2020   BUN 16 07/26/2020   BUN 18 04/18/2019   CREATININE 0.78 07/26/2020   CALCIUM 9.8 07/26/2020   PROT 8.3 07/26/2020   BILITOT 0.8 07/26/2020   ALKPHOS 87 07/26/2020   ALT 10 07/26/2020   AST 14 07/26/2020   LIPID: Lab Results  Component Value Date   CHOL 205 (H) 07/26/2020   TRIG 73.0 07/26/2020   HDL 51.20 07/26/2020   LDLCALC 139 (H) 07/26/2020    Objective:  BP 100/62 (BP Location: Left Arm, Patient Position: Sitting, Cuff Size: Normal)   Pulse 79   Temp 98 F (36.7 C) (Oral)   Ht 5' 7.25" (1.708 m)   Wt 143 lb 12.8 oz (65.2 kg)   SpO2 97%   BMI 22.36 kg/m   Weight: 143 lb 12.8 oz (65.2 kg)   BP Readings from Last 3 Encounters:  07/26/20 100/62  07/14/19 110/62  05/17/19 92/62   Wt Readings from Last 3 Encounters:  07/26/20 143 lb 12.8 oz (65.2 kg)  07/14/19 184 lb 3.2 oz (83.6 kg)  05/17/19 185 lb 6.4 oz (84.1 kg)    Physical Exam Constitutional:      General: She is not in acute distress.    Appearance: She is well-developed.  HENT:     Head: Normocephalic and atraumatic.     Right Ear: External ear normal.     Left Ear: External ear normal.     Mouth/Throat:     Pharynx: No oropharyngeal exudate.  Eyes:     Conjunctiva/sclera: Conjunctivae normal.     Pupils: Pupils are equal, round, and reactive to light.  Neck:     Thyroid: No thyromegaly.  Cardiovascular:     Rate and Rhythm: Normal rate and regular rhythm.     Heart sounds: Normal heart sounds. No murmur heard. No friction rub. No gallop.   Pulmonary:     Effort: Pulmonary effort  is normal.     Breath sounds: Normal breath sounds.  Abdominal:     General: Abdomen is flat. Bowel sounds are normal. There is no distension.     Palpations: Abdomen is soft. There is no mass.     Tenderness: There is abdominal tenderness in the right lower quadrant, suprapubic area and left lower quadrant. There is guarding. There is no right CVA tenderness or left CVA tenderness.     Hernia: No hernia is present.     Comments: LLQ>RLQ tender. This is some fullness with  palpation here. She states this is where she is always tender; worse when needing to have bowel movement.   Musculoskeletal:        General: No tenderness or deformity. Normal range of motion.     Cervical back: Normal range of motion and neck supple.  Lymphadenopathy:     Cervical: No cervical adenopathy.  Skin:    General: Skin is warm and dry.     Findings: No rash.  Neurological:     Mental Status: She is alert and oriented to person, place, and time.     Deep Tendon Reflexes: Reflexes normal.     Reflex Scores:      Tricep reflexes are 2+ on the right side and 2+ on the left side.      Bicep reflexes are 2+ on the right side and 2+ on the left side.      Brachioradialis reflexes are 2+ on the right side and 2+ on the left side.      Patellar reflexes are 2+ on the right side and 2+ on the left side. Psychiatric:        Speech: Speech normal.        Behavior: Behavior normal.        Thought Content: Thought content normal.     Assessment/Plan: Health Maintenance Due  Topic Date Due  . Hepatitis C Screening  Never done  . PAP SMEAR-Modifier  06/11/2020   Health Maintenance reviewed.  1. Preventative health care Keep up with regular exercise and healthy eating. She is eating less, smaller portions, but feeling better. Has lost weight, but overall body feeling healthier and she is eating a well rounded and healthy diet.   2. Hyperlipidemia, unspecified hyperlipidemia type - Comprehensive metabolic panel;  Future - Lipid panel; Future - TSH; Future - Comprehensive metabolic panel - Lipid panel - TSH  3. Elevated liver enzymes - CBC with Differential/Platelet; Future - CBC with Differential/Platelet  4. Weight loss Secondary to dietary changes; she has done well with this and has had improvement in abd discomfort with smaller meals; healthy eating.  - T4, free; Future - T3, free; Future - T4, free - T3, free  5. Lower abdominal pain Has had extensive evaluation for this. Has significant adhesions/endometriosis, but wishes to avoid surgery. Feels she is managing this now with her management of regular bowel movements, smaller meals.  - Amylase; Future - Lipase; Future - Lipase - Amylase - Urinalysis with Culture Reflex  6. B12 deficiency - Vitamin B12; Future - Vitamin B12  7. Vitamin D deficiency - VITAMIN D 25 Hydroxy (Vit-D Deficiency, Fractures); Future - VITAMIN D 25 Hydroxy (Vit-D Deficiency, Fractures)  Return for pending labs.  Micheline Rough, MD

## 2020-07-31 ENCOUNTER — Encounter: Payer: Self-pay | Admitting: Family Medicine

## 2020-08-06 ENCOUNTER — Other Ambulatory Visit: Payer: Self-pay | Admitting: Gastroenterology

## 2020-08-06 DIAGNOSIS — N809 Endometriosis, unspecified: Secondary | ICD-10-CM

## 2020-08-06 DIAGNOSIS — R109 Unspecified abdominal pain: Secondary | ICD-10-CM

## 2020-08-06 DIAGNOSIS — R634 Abnormal weight loss: Secondary | ICD-10-CM

## 2020-08-22 ENCOUNTER — Inpatient Hospital Stay: Admission: RE | Admit: 2020-08-22 | Payer: BC Managed Care – PPO | Source: Ambulatory Visit

## 2020-11-13 ENCOUNTER — Other Ambulatory Visit: Payer: BC Managed Care – PPO

## 2021-01-14 LAB — HM PAP SMEAR
HM Pap smear: NEGATIVE
HPV, high-risk: NEGATIVE

## 2021-04-16 ENCOUNTER — Other Ambulatory Visit: Payer: Self-pay | Admitting: Nurse Practitioner

## 2021-04-16 DIAGNOSIS — N926 Irregular menstruation, unspecified: Secondary | ICD-10-CM

## 2021-05-13 ENCOUNTER — Other Ambulatory Visit: Payer: BC Managed Care – PPO

## 2021-12-09 ENCOUNTER — Encounter: Payer: Self-pay | Admitting: Family Medicine

## 2021-12-09 ENCOUNTER — Ambulatory Visit: Payer: BC Managed Care – PPO | Admitting: Family Medicine

## 2021-12-09 VITALS — BP 102/70 | HR 63 | Temp 97.7°F | Wt 151.2 lb

## 2021-12-09 DIAGNOSIS — R300 Dysuria: Secondary | ICD-10-CM | POA: Diagnosis not present

## 2021-12-09 DIAGNOSIS — N390 Urinary tract infection, site not specified: Secondary | ICD-10-CM | POA: Diagnosis not present

## 2021-12-09 LAB — POC URINALSYSI DIPSTICK (AUTOMATED)
Bilirubin, UA: NEGATIVE
Blood, UA: NEGATIVE
Glucose, UA: NEGATIVE
Ketones, UA: NEGATIVE
Leukocytes, UA: NEGATIVE
Nitrite, UA: NEGATIVE
Protein, UA: NEGATIVE
Spec Grav, UA: <=1.005 — AB (ref 1.010–1.025)
Urobilinogen, UA: 0.2 E.U./dL
pH, UA: 6.5 (ref 5.0–8.0)

## 2021-12-09 MED ORDER — CIPROFLOXACIN HCL 500 MG PO TABS: 500.0000 mg | ORAL_TABLET | Freq: Two times a day (BID) | ORAL | 0 refills | Status: DC

## 2021-12-09 NOTE — Progress Notes (Signed)
   Subjective:    Patient ID: Eileen Martinez, female    DOB: 05-24-1970, 51 y.o.   MRN: [id number]  HPI Here for a possible UTI. For the past 2 days she has had urgency to urinate along with burning. No back pain or fever. Drinking lots of water and taking AZO. She has not had a UTI for some years now.    Review of Systems  Constitutional: Negative.   HENT: Negative.    Eyes: Negative.   Respiratory: Negative.    Cardiovascular: Negative.   Gastrointestinal: Negative.   Genitourinary:  Positive for dysuria, frequency and urgency. Negative for decreased urine volume, difficulty urinating, dyspareunia, enuresis, flank pain, hematuria and pelvic pain.  Musculoskeletal: Negative.   Skin: Negative.   Neurological: Negative.  Negative for headaches.  Psychiatric/Behavioral: Negative.         Objective:   Physical Exam Constitutional:      Appearance: Normal appearance. She is not ill-appearing.  Cardiovascular:     Rate and Rhythm: Normal rate and regular rhythm.     Pulses: Normal pulses.     Heart sounds: Normal heart sounds.  Pulmonary:     Effort: Pulmonary effort is normal.     Breath sounds: Normal breath sounds.  Abdominal:     General: Abdomen is flat. Bowel sounds are normal. There is no distension.     Palpations: Abdomen is soft. There is no mass.     Tenderness: There is no abdominal tenderness. There is no right CVA tenderness, left CVA tenderness, guarding or rebound.     Hernia: No hernia is present.  Neurological:     Mental Status: She is alert.           Assessment & Plan:  UTI, treat with 7 days of Cipro. Culture the sample.  Alysia Penna, MD

## 2021-12-10 LAB — URINE CULTURE
MICRO NUMBER:: 13720724
SPECIMEN QUALITY:: ADEQUATE

## 2021-12-12 ENCOUNTER — Telehealth: Payer: Self-pay | Admitting: Family Medicine

## 2021-12-12 NOTE — Telephone Encounter (Signed)
See results note. 

## 2021-12-12 NOTE — Telephone Encounter (Addendum)
Pt called back returning CMA's phone call. CMA will return her call.

## 2022-01-06 ENCOUNTER — Encounter: Payer: BC Managed Care – PPO | Admitting: Family Medicine

## 2022-02-25 LAB — HM MAMMOGRAPHY

## 2022-05-06 ENCOUNTER — Other Ambulatory Visit: Payer: Self-pay | Admitting: Nurse Practitioner

## 2022-05-06 DIAGNOSIS — N926 Irregular menstruation, unspecified: Secondary | ICD-10-CM

## 2022-05-28 ENCOUNTER — Ambulatory Visit
Admission: RE | Admit: 2022-05-28 | Discharge: 2022-05-28 | Disposition: A | Discharge status: Home / Self Care | Payer: BC Managed Care – PPO | Source: Ambulatory Visit | Attending: Nurse Practitioner | Admitting: Nurse Practitioner

## 2022-05-28 DIAGNOSIS — N926 Irregular menstruation, unspecified: Secondary | ICD-10-CM

## 2022-07-14 ENCOUNTER — Other Ambulatory Visit: Payer: Self-pay | Admitting: Nurse Practitioner

## 2022-07-14 DIAGNOSIS — D251 Intramural leiomyoma of uterus: Secondary | ICD-10-CM

## 2022-08-10 ENCOUNTER — Ambulatory Visit
Admission: RE | Admit: 2022-08-10 | Discharge: 2022-08-10 | Disposition: A | Discharge status: Home / Self Care | Payer: BC Managed Care – PPO | Source: Ambulatory Visit | Attending: Nurse Practitioner | Admitting: Nurse Practitioner

## 2022-08-10 DIAGNOSIS — D251 Intramural leiomyoma of uterus: Secondary | ICD-10-CM

## 2022-08-11 ENCOUNTER — Telehealth: Payer: Self-pay | Admitting: Family Medicine

## 2022-08-11 NOTE — Telephone Encounter (Signed)
Pt sent msg through mychart to sched an appt however pt currently does not have a PCP due to Dr. Ethlyn Gallery being gone for almost a year.   Need to sched a TOC with Rona Ravens or [place].   FYI

## 2023-04-19 ENCOUNTER — Ambulatory Visit: Payer: BC Managed Care – PPO | Admitting: Internal Medicine

## 2023-04-19 ENCOUNTER — Encounter: Payer: Self-pay | Admitting: Internal Medicine

## 2023-04-19 VITALS — BP 110/74 | HR 62 | Temp 98.2°F | Ht 68.0 in | Wt 170.4 lb

## 2023-04-19 DIAGNOSIS — Z23 Encounter for immunization: Secondary | ICD-10-CM

## 2023-04-19 DIAGNOSIS — Z131 Encounter for screening for diabetes mellitus: Secondary | ICD-10-CM

## 2023-04-19 DIAGNOSIS — N809 Endometriosis, unspecified: Secondary | ICD-10-CM | POA: Diagnosis not present

## 2023-04-19 DIAGNOSIS — E049 Nontoxic goiter, unspecified: Secondary | ICD-10-CM

## 2023-04-19 DIAGNOSIS — Z0000 Encounter for general adult medical examination without abnormal findings: Secondary | ICD-10-CM | POA: Diagnosis not present

## 2023-04-19 DIAGNOSIS — Z1159 Encounter for screening for other viral diseases: Secondary | ICD-10-CM

## 2023-04-19 DIAGNOSIS — Z136 Encounter for screening for cardiovascular disorders: Secondary | ICD-10-CM | POA: Diagnosis not present

## 2023-04-19 LAB — CBC WITH DIFFERENTIAL/PLATELET
Basophils Absolute: 0 10*3/uL (ref 0.0–0.1)
Basophils Relative: 0.6 % (ref 0.0–3.0)
Eosinophils Absolute: 0.1 10*3/uL (ref 0.0–0.7)
Eosinophils Relative: 1.3 % (ref 0.0–5.0)
HCT: 39.7 % (ref 36.0–46.0)
Hemoglobin: 13.1 g/dL (ref 12.0–15.0)
Lymphocytes Relative: 43.6 % (ref 12.0–46.0)
Lymphs Abs: 2.5 10*3/uL (ref 0.7–4.0)
MCHC: 32.8 g/dL (ref 30.0–36.0)
MCV: 93 fL (ref 78.0–100.0)
Monocytes Absolute: 0.6 10*3/uL (ref 0.1–1.0)
Monocytes Relative: 11 % (ref 3.0–12.0)
Neutro Abs: 2.5 10*3/uL (ref 1.4–7.7)
Neutrophils Relative %: 43.5 % (ref 43.0–77.0)
Platelets: 182 10*3/uL (ref 150.0–400.0)
RBC: 4.27 Mil/uL (ref 3.87–5.11)
RDW: 12.1 % (ref 11.5–15.5)
WBC: 5.7 10*3/uL (ref 4.0–10.5)

## 2023-04-19 LAB — VITAMIN B12: Vitamin B-12: 694 pg/mL (ref 211–911)

## 2023-04-19 LAB — TSH: TSH: 0.73 u[IU]/mL (ref 0.35–5.50)

## 2023-04-19 LAB — HEMOGLOBIN A1C: Hgb A1c MFr Bld: 5.2 % (ref 4.6–6.5)

## 2023-04-19 LAB — VITAMIN D 25 HYDROXY (VIT D DEFICIENCY, FRACTURES): VITD: 42.34 ng/mL (ref 30.00–100.00)

## 2023-04-19 NOTE — Addendum Note (Signed)
Addended by: Kern Reap B on: 04/19/2023 04:40 PM   Modules accepted: Orders

## 2023-04-19 NOTE — Progress Notes (Signed)
Established Patient Office Visit     CC/Reason for Visit: Establish care, annual preventive exam  HPI: Eileen Martinez is a 52 y.o. female who is coming in today for the above mentioned reasons. Past Medical History is significant for: Endometriosis status post ex lap in 2008 followed by Dr. Vickey Sages.  She is feeling well without acute concerns or complaints.  Has routine eye and dental care.  Does not smoke or drink.  She is due for COVID, flu and Tdap vaccines.  All cancer screening is up-to-date.   Past Medical/Surgical History: Past Medical History:  Diagnosis Date   Abdominal pain    Abnormal Pap smear    Blood in stool    Chicken pox    Elevated CA-125    Endometriosis    Fibroid    Herpes    Irregular menses    Ovarian cyst, right    6cm   Polyp of colon     Past Surgical History:  Procedure Laterality Date   MYOMECTOMY  2007    Social History:  reports that she has never smoked. She has never used smokeless tobacco. She reports that she does not drink alcohol and does not use drugs.  Allergies: No Known Allergies  Family History:  Family History  Problem Relation Age of Onset   Breast cancer Other 72       maternal aunt   Heart disease Other    Stroke Other        maternal aunt   Mental illness Other    Diabetes Other        maternal aunts x 2   Myasthenia gravis Mother    Hypertension Mother    Osteoarthritis Mother    Thyroid cancer Maternal Uncle    Thyroid cancer Paternal Uncle      Current Outpatient Medications:    EPIPEN 2-PAK 0.3 MG/0.3ML DEVI, , Disp: , Rfl:    estradiol (VIVELLE-DOT) 0.0375 MG/24HR, Apply 1 patch twice a week by transdermal route., Disp: , Rfl:   Review of Systems:  Negative unless indicated in HPI.   Physical Exam: Vitals:   04/19/23 1426  BP: 110/74  Pulse: 62  Temp: 98.2 F (36.8 C)  TempSrc: Oral  SpO2: 98%  Weight: 170 lb 6.4 oz (77.3 kg)  Height: 5\' 8"  (1.727 m)    Body mass index is  25.91 kg/m.   Physical Exam Vitals reviewed.  Constitutional:      General: She is not in acute distress.    Appearance: Normal appearance. She is not ill-appearing, toxic-appearing or diaphoretic.  HENT:     Head: Normocephalic.     Right Ear: Tympanic membrane, ear canal and external ear normal. There is no impacted cerumen.     Left Ear: Tympanic membrane, ear canal and external ear normal. There is no impacted cerumen.     Nose: Nose normal.     Mouth/Throat:     Mouth: Mucous membranes are moist.     Pharynx: Oropharynx is clear. No oropharyngeal exudate or posterior oropharyngeal erythema.  Eyes:     General: No scleral icterus.       Right eye: No discharge.        Left eye: No discharge.     Conjunctiva/sclera: Conjunctivae normal.     Pupils: Pupils are equal, round, and reactive to light.  Neck:     Vascular: No carotid bruit.  Cardiovascular:     Rate and Rhythm: Normal rate and regular  rhythm.     Pulses: Normal pulses.     Heart sounds: Normal heart sounds.  Pulmonary:     Effort: Pulmonary effort is normal. No respiratory distress.     Breath sounds: Normal breath sounds.  Abdominal:     General: Abdomen is flat. Bowel sounds are normal.     Palpations: Abdomen is soft.  Musculoskeletal:        General: Normal range of motion.     Cervical back: Normal range of motion.  Skin:    General: Skin is warm and dry.  Neurological:     General: No focal deficit present.     Mental Status: She is alert and oriented to person, place, and time. Mental status is at baseline.  Psychiatric:        Mood and Affect: Mood normal.        Behavior: Behavior normal.        Thought Content: Thought content normal.        Judgment: Judgment normal.     Flowsheet Row Office Visit from 04/19/2023 in [facility]  PHQ-9 Total Score 4       Impression and Plan:  Encounter for preventive health examination -     Hepatitis C antibody;  Future -     CBC with Differential/Platelet; Future -     Comprehensive metabolic panel; Future -     Hemoglobin A1c; Future -     Lipid panel; Future -     Vitamin B12; Future -     VITAMIN D 25 Hydroxy (Vit-D Deficiency, Fractures); Future  Endometriosis, followed by Dr. Renaldo Fiddler and Dr. Estrellita Ludwig, euthyroid -     TSH; Future  Encounter for hepatitis C screening test for low risk patient -     Hepatitis C antibody; Future  Immunization due  -Recommend routine eye and dental care. -Healthy lifestyle discussed in detail. -Labs to be updated today. -Prostate cancer screening: N/A Health Maintenance  Topic Date Due   Hepatitis C Screening  Never done   Zoster (Shingles) Vaccine (1 of 2) 01/27/2021   DTaP/Tdap/Td vaccine (2 - Td or Tdap) 03/04/2022   Flu Shot  12/10/2022   Pap with HPV screening  06/12/2023*   Mammogram  06/12/2023*   HIV Screening  11/24/2025*   Colon Cancer Screening  04/12/2025   COVID-19 Vaccine  Completed   HPV Vaccine  Aged Out  *Topic was postponed. The date shown is not the original due date.     -Flu and Tdap vaccines in office today.  Advised to update COVID-vaccine at pharmacy. -Obtain records from breast and cervical cancer screening from GYN office.    Chaya Jan, MD [facility]

## 2023-04-20 LAB — LIPID PANEL
Cholesterol: 269 mg/dL — ABNORMAL HIGH (ref 0–200)
HDL: 60.6 mg/dL (ref >39.00)
LDL Cholesterol: 184 mg/dL — ABNORMAL HIGH (ref 0–99)
NonHDL: 207.93
Total CHOL/HDL Ratio: 4
Triglycerides: 120 mg/dL (ref 0.0–149.0)
VLDL: 24 mg/dL (ref 0.0–40.0)

## 2023-04-20 LAB — COMPREHENSIVE METABOLIC PANEL
ALT: 16 U/L (ref 0–35)
AST: 16 U/L (ref 0–37)
Albumin: 4.5 g/dL (ref 3.5–5.2)
Alkaline Phosphatase: 89 U/L (ref 39–117)
BUN: 19 mg/dL (ref 6–23)
CO2: 27 meq/L (ref 19–32)
Calcium: 9.4 mg/dL (ref 8.4–10.5)
Chloride: 106 meq/L (ref 96–112)
Creatinine, Ser: 1.02 mg/dL (ref 0.40–1.20)
GFR: 63.38 mL/min (ref >60.00)
Glucose, Bld: 85 mg/dL (ref 70–99)
Potassium: 4.6 meq/L (ref 3.5–5.1)
Sodium: 141 meq/L (ref 135–145)
Total Bilirubin: 0.7 mg/dL (ref 0.2–1.2)
Total Protein: 7.3 g/dL (ref 6.0–8.3)

## 2023-04-20 LAB — HEPATITIS C ANTIBODY: Hepatitis C Ab: NONREACTIVE

## 2023-04-21 ENCOUNTER — Other Ambulatory Visit: Payer: Self-pay | Admitting: Internal Medicine

## 2023-04-21 ENCOUNTER — Encounter: Payer: Self-pay | Admitting: Internal Medicine

## 2023-04-21 DIAGNOSIS — E782 Mixed hyperlipidemia: Secondary | ICD-10-CM

## 2023-04-21 DIAGNOSIS — E785 Hyperlipidemia, unspecified: Secondary | ICD-10-CM | POA: Insufficient documentation

## 2023-04-21 MED ORDER — ATORVASTATIN CALCIUM 40 MG PO TABS: 40.0000 mg | ORAL_TABLET | Freq: Every day | ORAL | 1 refills | Status: AC

## 2024-04-19 ENCOUNTER — Encounter: Payer: BC Managed Care – PPO | Admitting: Internal Medicine

## 2024-04-23 ENCOUNTER — Telehealth: Admitting: Family

## 2024-04-23 DIAGNOSIS — B9689 Other specified bacterial agents as the cause of diseases classified elsewhere: Secondary | ICD-10-CM | POA: Diagnosis not present

## 2024-04-23 DIAGNOSIS — N760 Acute vaginitis: Secondary | ICD-10-CM

## 2024-04-23 MED ORDER — METRONIDAZOLE 500 MG PO TABS: 500.0000 mg | ORAL_TABLET | Freq: Two times a day (BID) | ORAL | 0 refills | Status: AC

## 2024-04-23 NOTE — Progress Notes (Signed)
 We are sorry that you are not feeling well. Here is how we plan to help! Based on what you shared with me it looks like you: May have a vaginosis due to bacteria  Vaginosis is an inflammation of the vagina that can result in discharge, itching and pain. The cause is usually a change in the normal balance of vaginal bacteria or an infection. Vaginosis can also result from reduced estrogen levels after menopause.  The most common causes of vaginosis are:   Bacterial vaginosis which results from an overgrowth of one on several organisms that are normally present in your vagina.   Yeast infections which are caused by a naturally occurring fungus called candida.   Vaginal atrophy (atrophic vaginosis) which results from the thinning of the vagina from reduced estrogen levels after menopause.   Trichomoniasis which is caused by a parasite and is commonly transmitted by sexual intercourse.  Factors that increase your risk of developing vaginosis include: Medications, such as antibiotics and steroids Uncontrolled diabetes Use of hygiene products such as bubble bath, vaginal spray or vaginal deodorant Douching Wearing damp or tight-fitting clothing Using an intrauterine device (IUD) for birth control Hormonal changes, such as those associated with pregnancy, birth control pills or menopause Sexual activity Having a sexually transmitted infection  Your treatment plan is Metronidazole  or Flagyl  500mg  twice a day for 7 days.  I have electronically sent this prescription into the pharmacy that you have chosen.  Be sure to take all of the medication as directed. Stop taking any medication if you develop a rash, tongue swelling or shortness of breath. Mothers who are breast feeding should consider pumping and discarding their breast milk while on these antibiotics. However, there is no consensus that infant exposure at these doses would be harmful.  Remember that medication creams can weaken latex  condoms.   HOME CARE:  Good hygiene may prevent some types of vaginosis from recurring and may relieve some symptoms:  Avoid baths, hot tubs and whirlpool spas. Rinse soap from your outer genital area after a shower, and dry the area well to prevent irritation. Don't use scented or harsh soaps, such as those with deodorant or antibacterial action. Avoid irritants. These include scented tampons and pads. Wipe from front to back after using the toilet. Doing so avoids spreading fecal bacteria to your vagina.  Other things that may help prevent vaginosis include:  Don't douche. Your vagina doesn't require cleansing other than normal bathing. Repetitive douching disrupts the normal organisms that reside in the vagina and can actually increase your risk of vaginal infection. Douching won't clear up a vaginal infection. Use a latex condom. Both female and female latex condoms may help you avoid infections spread by sexual contact. Wear cotton underwear. Also wear pantyhose with a cotton crotch. If you feel comfortable without it, skip wearing underwear to bed. Yeast thrives in [employer] Your symptoms should improve in the next day or two.  GET HELP RIGHT AWAY IF:  You have pain in your lower abdomen ( pelvic area or over your ovaries) You develop nausea or vomiting You develop a fever Your discharge changes or worsens You have persistent pain with intercourse You develop shortness of breath, a rapid pulse, or you faint.  These symptoms could be signs of problems or infections that need to be evaluated by a medical provider now.  MAKE SURE YOU   Understand these instructions. Will watch your condition. Will get help right away if you are not doing  well or get worse.  Your e-visit answers were reviewed by a board certified advanced clinical practitioner to complete your personal care plan. Depending upon the condition, your plan could have included both over the counter or  prescription medications. Please review your pharmacy choice to make sure that you have choses a pharmacy that is open for you to pick up any needed prescription, Your safety is important to us . If you have drug allergies check your prescription carefully.   You can use MyChart to ask questions about today's visit, request a non-urgent call back, or ask for a work or school excuse for 24 hours related to this e-Visit. If it has been greater than 24 hours you will need to follow up with your provider, or enter a new e-Visit to address those concerns. You will get a MyChart message within the next two days asking about your experience. I hope that your e-visit has been valuable and will speed your recovery.  I have spent 5 minutes in review of e-visit questionnaire, review and updating patient chart, medical decision making and response to patient.   Bari Learn, FNP

## 2024-06-06 ENCOUNTER — Encounter: Admitting: Internal Medicine

## 2024-07-13 ENCOUNTER — Encounter: Admitting: Internal Medicine
